# Patient Record
Sex: Male | Born: 1955 | Race: Black or African American | Hispanic: No | Marital: Married | Smoking: Former smoker
Health system: Southern US, Community
[De-identification: ages and names within clinical notes are randomized; demographics above are authoritative.]

## PROBLEM LIST (undated history)

## (undated) DIAGNOSIS — D573 Sickle-cell trait: Secondary | ICD-10-CM

## (undated) DIAGNOSIS — I10 Essential (primary) hypertension: Secondary | ICD-10-CM

## (undated) DIAGNOSIS — E785 Hyperlipidemia, unspecified: Secondary | ICD-10-CM

## (undated) HISTORY — DX: Hyperlipidemia, unspecified: E78.5

## (undated) HISTORY — PX: WRIST SURGERY: SHX841

## (undated) HISTORY — DX: Sickle-cell trait: D57.3

## (undated) HISTORY — PX: TOTAL HIP ARTHROPLASTY: SHX124

## (undated) HISTORY — DX: Essential (primary) hypertension: I10

## (undated) HISTORY — PX: POLYPECTOMY: SHX149

---

## 1999-03-13 ENCOUNTER — Emergency Department (HOSPITAL_COMMUNITY): Admission: EM | Admit: 1999-03-13 | Discharge: 1999-03-13 | Payer: Self-pay

## 1999-03-18 ENCOUNTER — Emergency Department (HOSPITAL_COMMUNITY): Admission: EM | Admit: 1999-03-18 | Discharge: 1999-03-18 | Payer: Self-pay | Admitting: Emergency Medicine

## 2000-05-20 ENCOUNTER — Emergency Department (HOSPITAL_COMMUNITY): Admission: EM | Admit: 2000-05-20 | Discharge: 2000-05-20 | Payer: Self-pay | Admitting: *Deleted

## 2000-05-21 ENCOUNTER — Encounter: Payer: Self-pay | Admitting: Emergency Medicine

## 2002-02-20 ENCOUNTER — Encounter: Admission: RE | Admit: 2002-02-20 | Discharge: 2002-02-20 | Payer: Self-pay | Admitting: Orthopedic Surgery

## 2002-02-20 ENCOUNTER — Encounter: Payer: Self-pay | Admitting: Orthopedic Surgery

## 2002-11-01 ENCOUNTER — Encounter: Payer: Self-pay | Admitting: Orthopedic Surgery

## 2002-11-06 ENCOUNTER — Encounter: Payer: Self-pay | Admitting: Orthopedic Surgery

## 2002-11-06 ENCOUNTER — Inpatient Hospital Stay (HOSPITAL_COMMUNITY): Admission: RE | Admit: 2002-11-06 | Discharge: 2002-11-10 | Payer: Self-pay | Admitting: Orthopedic Surgery

## 2008-12-17 ENCOUNTER — Emergency Department (HOSPITAL_COMMUNITY): Admission: EM | Admit: 2008-12-17 | Discharge: 2008-12-17 | Payer: Self-pay | Admitting: Emergency Medicine

## 2011-03-04 NOTE — Op Note (Signed)
NAME:  Ian Gordon, Ian Gordon                           ACCOUNT NO.:  [id number]   MEDICAL RECORD NO.:  [id number]                   PATIENT TYPE:  INP   LOCATION:  5038                                 FACILITY:  [facility]   PHYSICIAN:  Loreta Ave, M.D.              DATE OF BIRTH:  1956-01-22   DATE OF PROCEDURE:  11/06/2002  DATE OF DISCHARGE:                                 OPERATIVE REPORT   PREOPERATIVE DIAGNOSIS:  End-stage degenerative arthritis, left hip, status  post open reduction, internal fixation of acetabular fracture.   POSTOPERATIVE DIAGNOSIS:  End-stage degenerative arthritis, left hip, status  post open reduction, internal fixation of acetabular fracture.   PROCEDURE:  Left total hip replacement, Osteonics prosthesis.  58-mm Trident  cup.  36-mm internal-diameter ceramic insert, size G.  A #10 femoral  component with a 16-mm distal stem and a +0 36-mm ceramic head.  Also  removal of trochanteric and acetabular screws.   SURGEON:  Loreta Ave, M.D.   ASSISTANT:  Arlys John D. Petrarca, P.A.-C.   ANESTHESIA:  General.   BLOOD LOSS:  200 mL.   BLOOD GIVEN:  None.   SPECIMENS:  Exposed bone and soft tissue.   CULTURES:  None.   COMPLICATIONS:  None.   DRESSINGS:  Soft, compressive with abduction pillow.   PROCEDURE IN DETAIL:  The patient was brought to the operating room and  placed on the operating table in the supine position.  After adequate  anesthesia had been obtained, the patient was turned to a lateral position.  Incision utilizing previous incision superiorly extending to the trochanter  and then inferiorly over the femur.  Skin and subcutaneous tissue divided.  Iliotibial band exposed and incised.  Charnley retractor put in place.  Hip  exposed.  External rotators taken down and tagged with Ethibond.  Hip  dislocated posteriorly.  Trochanteric screws isolated and removed.  Femoral  head removed 1 cm above the lesser trochanter.  Acetabulum exposed.   Loose  bodies removed.  Spurs removed.  Reaming of the acetabulum up to 58 mm.  One  screw in the posterior aspect of the acetabulum had to be removed to  facilitate placement of the cup.  After appropriate reaming to good bleeding  bone, the cup was hammered in place at 45 degrees of abduction and 20  degrees of anteversion.  Fixed with two screws through the cup, a 16 mm and  a 20 mm.  The 0-degree, 36-mm internal-diameter, size G insert was placed in  the cup.  Retractors removed.  Femur exposed.  Sequential reaming proximally  and distally with the hand-held and power reamers up to a #10 component with  a 16-mm distal tip.  After appropriate trial reductions, definitive  component hammered in place.  Small crack in the calcar extending down about  a centimeter was appreciated with placement of the component.  This did not  gap and was not unstable.  No cable necessary.  With the +0 head, the hip  was reduced.  Excellent restoration of leg lengths.  Good stability in  flexion and extension.  Hip remained reduced.  External rotator and capsule  repaired to the back of the intertrochanteric groove through drill holes and  sutures tied over a bony bridge.  Wound irrigated.  Charnley retractor  removed.  Iliotibial band closed with #1 Vicryl.  Skin and subcutaneous  tissue with Vicryl and staples.  Margin of wound injected with Marcaine.  Sterile compressive dressing applied.  Returned to supine position.  Abduction pillow placed.  Anesthesia reversed.  Brought to the recovery  room.  Tolerated the surgery well, no complications.                                                Loreta Ave, M.D.    DFM/MEDQ  D:  11/07/2002  T:  11/07/2002  Job:  [id number]

## 2011-03-04 NOTE — Discharge Summary (Signed)
NAME:  Ian Gordon, Ian Gordon                           ACCOUNT NO.:  [id number]   MEDICAL RECORD NO.:  [id number]                   PATIENT TYPE:  INP   LOCATION:  5038                                 FACILITY:  [facility]   PHYSICIAN:  Loreta Ave, M.D.              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   PROCEDURE:  Left total hip replacement.   HISTORY OF PRESENT ILLNESS:  This is a 55 year old, African-American male  who in 1989, underwent an open reduction internal fixation of left  acetabulum for a traumatic fracture dislocation of the hip.  He has had  worsening groin pain on the left.  He was injected in May with good relief  for about three months.  He is now having worsening pain.  He is now  indicated for left total hip replacement.   HOSPITAL COURSE:  This is a 55 year old male admitted November 06, 2002, and  after appropriate laboratory studies were obtained, was taken to the  operating room where he underwent a left ceramic total hip replacement.  He  tolerated the procedure well.  He was placed preoperatively on Ancef 1 g IV  on call as well as 1 g IV q.8h. x3 doses postoperatively.  Heparin 5000  units subcu q.12h. was begun until his Coumadin became therapeutic.  PCA  Dilaudid pump was used for postop pain.  Consultation with PT/OT were made.  Ambulation with touchdown weightbearing with 5-10 pounds only on the left.  Abduction pillow was placed postoperatively.  He was allowed out of bed to  chair the following day.  He ambulated very efficiently and very quickly  postoperatively.  He had an unremarkable hospital course and was discharged  on January 25, to return  back to the office in followup.  He was discharged  in improved condition.   DISCHARGE LABORATORY DATA AND X-RAY FINDINGS:  EKG showed sinus bradycardia  with minimal voltage criteria for LVH.  This may be a normal variant.  Hip x-  ray of November 06, 2002, shows postoperative changes in the left hip with no  complicating factors.   Preoperatively, hemoglobin was 15.1, hematocrit 45.0%, white count 5700,  platelets 228,000.  Discharge hemoglobin 10.1, hematocrit 30%.  Chemistries  preoperatively with sodium 139, potassium 4.6, chloride 108, CO2 25, glucose  87, BUN 14, creatinine 1.1, calcium 9.2.  Total protein 7.0, albumin 3.8,  AST 20, ALT 16, Alk phos 61, total bilirubin 1.4.  Discharge sodium 134,  potassium 3.8, chloride 100, CO2 26, glucose 121, BUN 6, creatinine 1.1,  calcium 8.6.  Urinalysis was benign for  a voided urine.  Blood type O  positive.  Antibody screen negative.   DISCHARGE MEDICATIONS:  1. Percocet 5/325 one to two every four hours as needed for pain.  2. Coumadin 5 mg daily.   ACTIVITY:  He will be up with his walker as instructed by physical therapy.  Follow total hip replacement protocol.   DIET:  No restrictions.   WOUND CARE:  Keep wound clean and dry with dressing on daily.  [facility] for  home health.   FOLLOW UP:  Follow back up with [place] in 10-14 days for recheck evaluation.   CONDITION ON DISCHARGE:  Improved.     Oris Drone Petrarca, P.A.-C.                Loreta Ave, M.D.    BDP/MEDQ  D:  12/23/2002  T:  12/24/2002  Job:  [id number]

## 2013-12-27 ENCOUNTER — Encounter (HOSPITAL_COMMUNITY): Payer: Self-pay | Admitting: Emergency Medicine

## 2013-12-27 ENCOUNTER — Emergency Department (HOSPITAL_COMMUNITY)
Admission: EM | Admit: 2013-12-27 | Discharge: 2013-12-27 | Disposition: A | Discharge status: Home / Self Care | Payer: No Typology Code available for payment source | Source: Home / Self Care

## 2013-12-27 DIAGNOSIS — I10 Essential (primary) hypertension: Secondary | ICD-10-CM

## 2013-12-27 DIAGNOSIS — R0781 Pleurodynia: Secondary | ICD-10-CM

## 2013-12-27 DIAGNOSIS — R079 Chest pain, unspecified: Secondary | ICD-10-CM

## 2013-12-27 LAB — POCT URINALYSIS DIP (DEVICE)
Bilirubin Urine: NEGATIVE
Glucose, UA: NEGATIVE mg/dL
Hgb urine dipstick: NEGATIVE
Ketones, ur: NEGATIVE mg/dL
Leukocytes, UA: NEGATIVE
Nitrite: NEGATIVE
Protein, ur: NEGATIVE mg/dL
Specific Gravity, Urine: 1.025 (ref 1.005–1.030)
Urobilinogen, UA: 0.2 mg/dL (ref 0.0–1.0)
pH: 5.5 (ref 5.0–8.0)

## 2013-12-27 MED ORDER — NAPROXEN 375 MG PO TABS: 375.0000 mg | ORAL_TABLET | Freq: Two times a day (BID) | ORAL | Status: DC

## 2013-12-27 NOTE — ED Provider Notes (Signed)
CSN: [id number]     Arrival date & time 12/27/13  1217 History   First MD Initiated Contact with Patient 12/27/13 1454     Chief Complaint  Patient presents with  . Back Pain   (Consider location/radiation/quality/duration/timing/severity/associated sxs/prior Treatment) HPI Comments: 58 year old male complaining of pain in the right lateral chest and back for 2 weeks. It is becoming progressively worse. It is located along the midaxillary line towards the posterior axillary line over the costochondral margins. It is worse with sitting improved with lying. It is described as sharp and achy.   History reviewed. No pertinent past medical history. History reviewed. No pertinent past surgical history. No family history on file. History  Substance Use Topics  . Smoking status: Never Smoker   . Smokeless tobacco: Not on file  . Alcohol Use: Yes    Review of Systems  Constitutional: Negative for chills, diaphoresis, appetite change and fatigue.  HENT: Negative.   Respiratory: Negative.  Negative for cough, choking, chest tightness, shortness of breath, wheezing and stridor.   Cardiovascular: Negative for chest pain, palpitations and leg swelling.  Gastrointestinal: Negative for nausea, vomiting, abdominal pain, diarrhea, constipation and blood in stool.  Genitourinary: Negative for dysuria, urgency, frequency, hematuria and difficulty urinating.  Musculoskeletal: Positive for back pain. Negative for neck pain.  Skin: Negative.   Neurological: Negative.     Allergies  Review of patient's allergies indicates no known allergies.  Home Medications   Current Outpatient Rx  Name  Route  Sig  Dispense  Refill  . ibuprofen (ADVIL,MOTRIN) 200 MG tablet   Oral   Take 200 mg by mouth every 6 (six) hours as needed.         . naproxen (NAPROSYN) 375 MG tablet   Oral   Take 1 tablet (375 mg total) by mouth 2 (two) times daily.   20 tablet   0    BP 150/102  Pulse 71  Temp(Src) 98.2  F (36.8 C) (Oral)  Resp 16  SpO2 100% Physical Exam  Nursing note and vitals reviewed. Constitutional: He is oriented to person, place, and time. He appears well-developed and well-nourished.  HENT:  Head: Normocephalic and atraumatic.  Eyes: Conjunctivae and EOM are normal. Left eye exhibits no discharge.  Neck: Normal range of motion. Neck supple.  Cardiovascular: Normal rate, regular rhythm and normal heart sounds.   Pulmonary/Chest: Effort normal and breath sounds normal. No respiratory distress. He has no wheezes. He has no rales. He exhibits tenderness.  Reproducible tenderness along the right lower costal margin from the anterolateral aspect to the right posterior axillary line.  Abdominal: Soft. He exhibits no distension and no mass. There is no tenderness. There is no rebound and no guarding.  Musculoskeletal: He exhibits no edema.  Having patient leaned forward to flex this time elicits pain in the area described above. Having the patient lean forward and rotate to the left also  reproduces and  exacerbates the pain.  Neurological: He is alert and oriented to person, place, and time. No cranial nerve deficit. He exhibits normal muscle tone.  Skin: Skin is warm and dry. No rash noted. No erythema.  Psychiatric: He has a normal mood and affect.    ED Course  Procedures (including critical care time) Labs Review Labs Reviewed  POCT URINALYSIS DIP (DEVICE)   Imaging Review No results found. Results for orders placed during the hospital encounter of 12/27/13  POCT URINALYSIS DIP (DEVICE)      Result Value Ref  Range   Glucose, UA NEGATIVE  NEGATIVE mg/dL   Bilirubin Urine NEGATIVE  NEGATIVE   Ketones, ur NEGATIVE  NEGATIVE mg/dL   Specific Gravity, Urine 1.025  1.005 - 1.030   Hgb urine dipstick NEGATIVE  NEGATIVE   pH 5.5  5.0 - 8.0   Protein, ur NEGATIVE  NEGATIVE mg/dL   Urobilinogen, UA 0.2  0.0 - 1.0 mg/dL   Nitrite NEGATIVE  NEGATIVE   Leukocytes, UA NEGATIVE   NEGATIVE     MDM   1. Rib pain on right side   2. HTN (hypertension)      Naprosyn as dir, take with food Limit carrying heavy objects esp L arm Ice locally. Discussed red flags sx's for GI, pulmonary or GU problems.  See PCP for HTN     Janne Napoleon, NP 12/27/13 1516  Janne Napoleon, NP 12/27/13 [phone]

## 2013-12-27 NOTE — ED Notes (Signed)
Right side pain, radiates to right back, no known injury.  Pain has gradually worsened since onset of pain.  Onset ofpain 2 weeks ago.  No pain changes with deep inspiration, but does change with movement.  Denies uri symptoms

## 2013-12-27 NOTE — Discharge Instructions (Signed)
Chest Wall Pain °Chest wall pain is pain in or around the bones and muscles of your chest. It may take up to 6 weeks to get better. It may take longer if you must stay physically active in your work and activities.  °CAUSES  °Chest wall pain may happen on its own. However, it may be caused by: °· A viral illness like the flu. °· Injury. °· Coughing. °· Exercise. °· Arthritis. °· Fibromyalgia. °· Shingles. °HOME CARE INSTRUCTIONS  °· Avoid overtiring physical activity. Try not to strain or perform activities that cause pain. This includes any activities using your chest or your abdominal and side muscles, especially if heavy weights are used. °· Put ice on the sore area. °· Put ice in a plastic bag. °· Place a towel between your skin and the bag. °· Leave the ice on for 15-20 minutes per hour while awake for the first 2 days. °· Only take over-the-counter or prescription medicines for pain, discomfort, or fever as directed by your caregiver. °SEEK IMMEDIATE MEDICAL CARE IF:  °· Your pain increases, or you are very uncomfortable. °· You have a fever. °· Your chest pain becomes worse. °· You have new, unexplained symptoms. °· You have nausea or vomiting. °· You feel sweaty or lightheaded. °· You have a cough with phlegm (sputum), or you cough up blood. °MAKE SURE YOU:  °· Understand these instructions. °· Will watch your condition. °· Will get help right away if you are not doing well or get worse. °Document Released: 10/03/2005 Document Revised: 12/26/2011 Document Reviewed: 05/30/2011 °ExitCare® Patient Information ©2014 [employer]. ° °Costochondritis °Costochondritis, sometimes called Tietze syndrome, is a swelling and irritation (inflammation) of the tissue (cartilage) that connects your ribs with your breastbone (sternum). It causes pain in the chest and rib area. Costochondritis usually goes away on its own over time. It can take up to 6 weeks or longer to get better, especially if you are unable to limit your  activities. °CAUSES  °Some cases of costochondritis have no known cause. Possible causes include: °· Injury (trauma). °· Exercise or activity such as lifting. °· Severe coughing. °SIGNS AND SYMPTOMS °· Pain and tenderness in the chest and rib area. °· Pain that gets worse when coughing or taking deep breaths. °· Pain that gets worse with specific movements. °DIAGNOSIS  °Your health care provider will do a physical exam and ask about your symptoms. Chest X-rays or other tests may be done to rule out other problems. °TREATMENT  °Costochondritis usually goes away on its own over time. Your health care provider may prescribe medicine to help relieve pain. °HOME CARE INSTRUCTIONS  °· Avoid exhausting physical activity. Try not to strain your ribs during normal activity. This would include any activities using chest, abdominal, and side muscles, especially if heavy weights are used. °· Apply ice to the affected area for the first 2 days after the pain begins. °· Put ice in a plastic bag. °· Place a towel between your skin and the bag. °· Leave the ice on for 20 minutes, 2 3 times a day. °· Only take over-the-counter or prescription medicines as directed by your health care provider. °SEEK MEDICAL CARE IF: °· You have redness or swelling at the rib joints. These are signs of infection. °· Your pain does not go away despite rest or medicine. °SEEK IMMEDIATE MEDICAL CARE IF:  °· Your pain increases or you are very uncomfortable. °· You have shortness of breath or difficulty breathing. °· You   cough up blood. °· You have worse chest pains, sweating, or vomiting. °· You have a fever or persistent symptoms for more than 2 3 days. °· You have a fever and your symptoms suddenly get worse. °MAKE SURE YOU:  °· Understand these instructions. °· Will watch your condition. °· Will get help right away if you are not doing well or get worse. °Document Released: 07/13/2005 Document Revised: 07/24/2013 Document Reviewed:  05/07/2013 °ExitCare® Patient Information ©2014 [employer]. ° °

## 2013-12-28 NOTE — ED Provider Notes (Signed)
Medical screening examination/treatment/procedure(s) were performed by resident physician or non-physician practitioner and as supervising physician I was immediately available for consultation/collaboration.   KINDL,JAMES DOUGLAS MD.   James D Kindl, MD 12/28/13 1412 

## 2015-12-17 ENCOUNTER — Encounter: Payer: Self-pay | Admitting: Gastroenterology

## 2016-01-05 ENCOUNTER — Ambulatory Visit (AMBULATORY_SURGERY_CENTER): Payer: Self-pay

## 2016-01-05 VITALS — Ht 69.0 in | Wt 187.2 lb

## 2016-01-05 DIAGNOSIS — Z1211 Encounter for screening for malignant neoplasm of colon: Secondary | ICD-10-CM

## 2016-01-05 MED ORDER — NA SULFATE-K SULFATE-MG SULF 17.5-3.13-1.6 GM/177ML PO SOLN: ORAL | Status: DC

## 2016-01-05 NOTE — Progress Notes (Signed)
Per pt, no allergies to soy or egg products.Pt not taking any weight loss meds or using  O2 at home. 

## 2016-01-18 ENCOUNTER — Ambulatory Visit (AMBULATORY_SURGERY_CENTER): Payer: BLUE CROSS/BLUE SHIELD | Admitting: Gastroenterology

## 2016-01-18 ENCOUNTER — Encounter: Payer: Self-pay | Admitting: Gastroenterology

## 2016-01-18 VITALS — BP 106/76 | HR 71 | Temp 86.8°F | Resp 11 | Ht 69.0 in | Wt 187.0 lb

## 2016-01-18 DIAGNOSIS — D125 Benign neoplasm of sigmoid colon: Secondary | ICD-10-CM | POA: Diagnosis not present

## 2016-01-18 DIAGNOSIS — Z1211 Encounter for screening for malignant neoplasm of colon: Secondary | ICD-10-CM

## 2016-01-18 DIAGNOSIS — D122 Benign neoplasm of ascending colon: Secondary | ICD-10-CM

## 2016-01-18 HISTORY — PX: COLONOSCOPY: SHX174

## 2016-01-18 MED ORDER — SODIUM CHLORIDE 0.9 % IV SOLN: 500.0000 mL | INTRAVENOUS | Status: DC

## 2016-01-18 NOTE — Op Note (Signed)
[facility] Patient Name: Ian Gordon Procedure Date: 01/18/2016 3:33 PM MRN: [id number] Endoscopist: Remo Lipps P. Havery Moros , MD Age: 60 Referring MD:  Date of Birth: [birth year] Gender: Male Procedure:                Colonoscopy Indications:              Screening for colorectal malignant neoplasm Medicines:                Monitored Anesthesia Care Procedure:                Pre-Anesthesia Assessment:                           - Prior to the procedure, a History and Physical                            was performed, and patient medications and                            allergies were reviewed. The patient's tolerance of                            previous anesthesia was also reviewed. The risks                            and benefits of the procedure and the sedation                            options and risks were discussed with the patient.                            All questions were answered, and informed consent                            was obtained. Prior Anticoagulants: The patient has                            taken no previous anticoagulant or antiplatelet                            agents. ASA Grade Assessment: II - A patient with                            mild systemic disease. After reviewing the risks                            and benefits, the patient was deemed in                            satisfactory condition to undergo the procedure.                           After obtaining informed consent, the colonoscope  was passed under direct vision. Throughout the                            procedure, the patient's blood pressure, pulse, and                            oxygen saturations were monitored continuously. The                            Model PCF-H190L [id number]) scope was introduced                            through the anus and advanced to the the cecum,                            identified by appendiceal orifice and  ileocecal                            valve. The colonoscopy was performed without                            difficulty. The patient tolerated the procedure                            well. The quality of the bowel preparation was                            adequate. The ileocecal valve, appendiceal orifice,                            and rectum were photographed. Scope In: 3:35:55 PM Scope Out: 3:50:07 PM Scope Withdrawal Time: 0 hours 12 minutes 15 seconds  Total Procedure Duration: 0 hours 14 minutes 12 seconds  Findings:      The perianal and digital rectal examinations were normal.      Two sessile polyps were found in the ascending colon. The polyps were 3       to 5 mm in size. These polyps were removed with a cold snare. Resection       and retrieval were complete.      A 4 mm polyp was found in the sigmoid colon. The polyp was sessile. The       polyp was removed with a cold snare. Resection and retrieval were       complete.      Multiple small and large-mouthed diverticula were found in the left       colon.      Non-bleeding internal hemorrhoids were found during retroflexion. The       hemorrhoids were small.      The exam was otherwise normal throughout the examined colon. Complications:            No immediate complications. Estimated blood loss:                            Minimal. Estimated Blood Loss:     Estimated blood loss was minimal. Impression:               -  Two 3 to 5 mm polyps in the ascending colon,                            removed with a cold snare. Resected and retrieved.                           - One 4 mm polyp in the sigmoid colon, removed with                            a cold snare. Resected and retrieved.                           - Diverticulosis in the left colon.                           - Non-bleeding internal hemorrhoids. Recommendation:           - Patient has a contact number available for                            emergencies. The  signs and symptoms of potential                            delayed complications were discussed with the                            patient. Return to normal activities tomorrow.                            Written discharge instructions were provided to the                            patient.                           - Resume previous diet.                           - Continue present medications.                           - No aspirin, ibuprofen, naproxen, or other                            non-steroidal anti-inflammatory drugs for 1 week                            after polyp removal.                           - Await pathology results.                           - Repeat colonoscopy is recommended for  surveillance. The colonoscopy date will be                            determined after pathology results from today's                            exam become available for review. Procedure Code(s):        --- Professional ---                           [phone], Colonoscopy, flexible; with removal of                            tumor(s), polyp(s), or other lesion(s) by snare                            technique CPT copyright 2016 [employer]. All rights reserved. Remo Lipps P. Taliah Porche, MD 01/18/2016 3:55:02 PM This report has been signed electronically. Number of Addenda: 0 Referring MD:      Berkley Harvey

## 2016-01-18 NOTE — Progress Notes (Signed)
Called to room to assist during endoscopic procedure.  Patient ID and intended procedure confirmed with present staff. Received instructions for my participation in the procedure from the performing physician.  

## 2016-01-18 NOTE — Patient Instructions (Signed)
Impressions/recommendations:  Polyps (handout given) Diverticulosis (handout given) High Fiber Diet (handout given) Hemorrhoids (handout given)  No aspirin, aspirin containing products or NSAIDS for 2 weeks. May resume 4/18 if needed. Tylenol only until then.  YOU HAD AN ENDOSCOPIC PROCEDURE TODAY AT [facility]:   Refer to the procedure report that was given to you for any specific questions about what was found during the examination.  If the procedure report does not answer your questions, please call your gastroenterologist to clarify.  If you requested that your care partner not be given the details of your procedure findings, then the procedure report has been included in a sealed envelope for you to review at your convenience later.  YOU SHOULD EXPECT: Some feelings of bloating in the abdomen. Passage of more gas than usual.  Walking can help get rid of the air that was put into your GI tract during the procedure and reduce the bloating. If you had a lower endoscopy (such as a colonoscopy or flexible sigmoidoscopy) you may notice spotting of blood in your stool or on the toilet paper. If you underwent a bowel prep for your procedure, you may not have a normal bowel movement for a few days.  Please Note:  You might notice some irritation and congestion in your nose or some drainage.  This is from the oxygen used during your procedure.  There is no need for concern and it should clear up in a day or so.  SYMPTOMS TO REPORT IMMEDIATELY:   Following lower endoscopy (colonoscopy or flexible sigmoidoscopy):  Excessive amounts of blood in the stool  Significant tenderness or worsening of abdominal pains  Swelling of the abdomen that is new, acute  Fever of 100F or higher  For urgent or emergent issues, a gastroenterologist can be reached at any hour by calling [phone].   DIET: Your first meal following the procedure should be a small meal and then it is ok to  progress to your normal diet. Heavy or fried foods are harder to digest and may make you feel nauseous or bloated.  Likewise, meals heavy in dairy and vegetables can increase bloating.  Drink plenty of fluids but you should avoid alcoholic beverages for 24 hours.  ACTIVITY:  You should plan to take it easy for the rest of today and you should NOT DRIVE or use heavy machinery until tomorrow (because of the sedation medicines used during the test).    FOLLOW UP: Our staff will call the number listed on your records the next business day following your procedure to check on you and address any questions or concerns that you may have regarding the information given to you following your procedure. If we do not reach you, we will leave a message.  However, if you are feeling well and you are not experiencing any problems, there is no need to return our call.  We will assume that you have returned to your regular daily activities without incident.  If any biopsies were taken you will be contacted by phone or by letter within the next 1-3 weeks.  Please call us at [phone] if you have not heard about the biopsies in 3 weeks.    SIGNATURES/CONFIDENTIALITY: You and/or your care partner have signed paperwork which will be entered into your electronic medical record.  These signatures attest to the fact that that the information above on your After Visit Summary has been reviewed and is understood.  Full responsibility of the  confidentiality of this discharge information lies with you and/or your care-partner. 

## 2016-01-18 NOTE — Progress Notes (Signed)
Report to PACU, RN, vss, BBS= Clear.  

## 2016-01-19 ENCOUNTER — Telehealth: Payer: Self-pay

## 2016-01-19 NOTE — Telephone Encounter (Signed)
  Follow up Call-  Call back number 01/18/2016  Post procedure Call Back phone  # [phone]  Permission to leave phone message Yes     Patient questions:  Do you have a fever, pain , or abdominal swelling? No. Pain Score  0 *  Have you tolerated food without any problems? Yes.    Have you been able to return to your normal activities? Yes.    Do you have any questions about your discharge instructions: Diet   No. Medications  No. Follow up visit  No.  Do you have questions or concerns about your Care? No.  Actions: * If pain score is 4 or above: No action needed, pain <4.

## 2016-01-30 ENCOUNTER — Encounter: Payer: Self-pay | Admitting: Gastroenterology

## 2019-01-15 ENCOUNTER — Emergency Department (HOSPITAL_COMMUNITY): Payer: 59

## 2019-01-15 ENCOUNTER — Encounter (HOSPITAL_COMMUNITY): Payer: Self-pay | Admitting: Emergency Medicine

## 2019-01-15 ENCOUNTER — Emergency Department (HOSPITAL_COMMUNITY)
Admission: EM | Admit: 2019-01-15 | Discharge: 2019-01-15 | Disposition: A | Discharge status: Home / Self Care | Payer: 59 | Attending: Emergency Medicine | Admitting: Emergency Medicine

## 2019-01-15 ENCOUNTER — Other Ambulatory Visit: Payer: Self-pay

## 2019-01-15 DIAGNOSIS — S01111A Laceration without foreign body of right eyelid and periocular area, initial encounter: Secondary | ICD-10-CM | POA: Insufficient documentation

## 2019-01-15 DIAGNOSIS — Z96642 Presence of left artificial hip joint: Secondary | ICD-10-CM | POA: Insufficient documentation

## 2019-01-15 DIAGNOSIS — Y999 Unspecified external cause status: Secondary | ICD-10-CM | POA: Insufficient documentation

## 2019-01-15 DIAGNOSIS — Y929 Unspecified place or not applicable: Secondary | ICD-10-CM | POA: Insufficient documentation

## 2019-01-15 DIAGNOSIS — S0181XA Laceration without foreign body of other part of head, initial encounter: Secondary | ICD-10-CM

## 2019-01-15 DIAGNOSIS — Y939 Activity, unspecified: Secondary | ICD-10-CM | POA: Insufficient documentation

## 2019-01-15 DIAGNOSIS — I10 Essential (primary) hypertension: Secondary | ICD-10-CM | POA: Insufficient documentation

## 2019-01-15 DIAGNOSIS — W0110XA Fall on same level from slipping, tripping and stumbling with subsequent striking against unspecified object, initial encounter: Secondary | ICD-10-CM | POA: Insufficient documentation

## 2019-01-15 DIAGNOSIS — Y908 Blood alcohol level of 240 mg/100 ml or more: Secondary | ICD-10-CM | POA: Insufficient documentation

## 2019-01-15 DIAGNOSIS — Z23 Encounter for immunization: Secondary | ICD-10-CM | POA: Insufficient documentation

## 2019-01-15 DIAGNOSIS — S0990XA Unspecified injury of head, initial encounter: Secondary | ICD-10-CM | POA: Insufficient documentation

## 2019-01-15 DIAGNOSIS — Z79899 Other long term (current) drug therapy: Secondary | ICD-10-CM | POA: Diagnosis not present

## 2019-01-15 LAB — CBC WITH DIFFERENTIAL/PLATELET
ABS IMMATURE GRANULOCYTES: 0.01 10*3/uL (ref 0.00–0.07)
BASOS ABS: 0 10*3/uL (ref 0.0–0.1)
Basophils Relative: 1 %
Eosinophils Absolute: 0.2 10*3/uL (ref 0.0–0.5)
Eosinophils Relative: 3 %
HCT: 40.5 % (ref 39.0–52.0)
HEMOGLOBIN: 14.5 g/dL (ref 13.0–17.0)
IMMATURE GRANULOCYTES: 0 %
LYMPHS ABS: 2.5 10*3/uL (ref 0.7–4.0)
LYMPHS PCT: 41 %
MCH: 28.7 pg (ref 26.0–34.0)
MCHC: 35.8 g/dL (ref 30.0–36.0)
MCV: 80 fL (ref 80.0–100.0)
MONOS PCT: 10 %
Monocytes Absolute: 0.6 10*3/uL (ref 0.1–1.0)
NEUTROS PCT: 45 %
NRBC: 0 % (ref 0.0–0.2)
Neutro Abs: 2.8 10*3/uL (ref 1.7–7.7)
Platelets: 230 10*3/uL (ref 150–400)
RBC: 5.06 MIL/uL (ref 4.22–5.81)
RDW: 14.4 % (ref 11.5–15.5)
WBC: 6.1 10*3/uL (ref 4.0–10.5)

## 2019-01-15 LAB — COMPREHENSIVE METABOLIC PANEL
ALBUMIN: 3.7 g/dL (ref 3.5–5.0)
ALK PHOS: 66 U/L (ref 38–126)
ALT: 16 U/L (ref 0–44)
AST: 18 U/L (ref 15–41)
Anion gap: 7 (ref 5–15)
BILIRUBIN TOTAL: 0.8 mg/dL (ref 0.3–1.2)
BUN: 14 mg/dL (ref 8–23)
CALCIUM: 8.3 mg/dL — AB (ref 8.9–10.3)
CO2: 26 mmol/L (ref 22–32)
CREATININE: 1.15 mg/dL (ref 0.61–1.24)
Chloride: 104 mmol/L (ref 98–111)
GFR calc Af Amer: >60 mL/min (ref >60)
GFR calc non Af Amer: >60 mL/min (ref >60)
GLUCOSE: 90 mg/dL (ref 70–99)
POTASSIUM: 3.6 mmol/L (ref 3.5–5.1)
Sodium: 137 mmol/L (ref 135–145)
TOTAL PROTEIN: 7.1 g/dL (ref 6.5–8.1)

## 2019-01-15 LAB — ETHANOL: Alcohol, Ethyl (B): 273 mg/dL — ABNORMAL HIGH (ref <10)

## 2019-01-15 MED ORDER — TETANUS-DIPHTH-ACELL PERTUSSIS 5-2.5-18.5 LF-MCG/0.5 IM SUSP
0.5000 mL | Freq: Once | INTRAMUSCULAR | Status: AC
  Administered 2019-01-15: 0.5 mL via INTRAMUSCULAR
  Filled 2019-01-15: qty 0.5

## 2019-01-15 MED ORDER — LIDOCAINE-EPINEPHRINE (PF) 2 %-1:200000 IJ SOLN
20.0000 mL | Freq: Once | INTRAMUSCULAR | Status: AC
  Administered 2019-01-15: 20 mL
  Filled 2019-01-15: qty 20

## 2019-01-15 NOTE — ED Notes (Signed)
Patient verbalizes understanding of discharge instructions. Opportunity for questioning and answers were provided. Armband removed by staff, pt discharged from ED ambulatory to home.  

## 2019-01-15 NOTE — ED Provider Notes (Signed)
..  Laceration Repair Date/Time: 01/15/2019 10:19 PM Performed by: Volanda Napoleon, PA-C Authorized by: Volanda Napoleon, PA-C   Consent:    Consent obtained:  Verbal   Consent given by:  Patient   Risks discussed:  Infection, need for additional repair, pain, poor cosmetic result and poor wound healing   Alternatives discussed:  No treatment and delayed treatment Universal protocol:    Procedure explained and questions answered to patient or proxy's satisfaction: yes     Relevant documents present and verified: yes     Test results available and properly labeled: yes     Imaging studies available: yes     Required blood products, implants, devices, and special equipment available: yes     Site/side marked: yes     Immediately prior to procedure, a time out was called: yes     Patient identity confirmed:  Verbally with patient Anesthesia (see MAR for exact dosages):    Anesthesia method:  Local infiltration   Local anesthetic:  Lidocaine 1% WITH epi Laceration details:    Location:  Face   Face location:  R upper eyelid   Extent:  Partial thickness   Length (cm):  3 Repair type:    Repair type:  Intermediate Pre-procedure details:    Preparation:  Patient was prepped and draped in usual sterile fashion Exploration:    Hemostasis achieved with:  Direct pressure   Wound exploration: wound explored through full range of motion   Treatment:    Area cleansed with:  Betadine and Hibiclens   Amount of cleaning:  Extensive   Irrigation solution:  Sterile saline   Irrigation method:  Syringe   Visualized foreign bodies/material removed: no   Subcutaneous repair:    Suture size:  5-0   Suture material:  Vicryl   Suture technique:  Simple interrupted   Number of sutures:  2 Skin repair:    Repair method:  Sutures   Suture size:  5-0   Suture material:  Prolene   Suture technique:  Simple interrupted   Number of sutures:  6 Approximation:    Approximation:   Close Post-procedure details:    Dressing:  Antibiotic ointment and non-adherent dressing      Volanda Napoleon, PA-C 01/15/19 2220    Julianne Rice, MD 01/15/19 2314

## 2019-01-15 NOTE — ED Triage Notes (Signed)
Pt from home with ems for unwitnessed fall, pt has been drinking since 2pm today. Denies Si but states he has been going through a lot lately. C collar in place, laceration to left eyebrow. No blood thinners, denies chest pain or SOB. Pt alert upon arrival

## 2019-01-15 NOTE — ED Provider Notes (Signed)
[facility] Provider Note   CSN: [id number] Arrival date & time: 01/15/19  1941    History   Chief Complaint Chief Complaint  Patient presents with  . Fall  . Alcohol Intoxication    HPI Ian Gordon is a 63 y.o. male.     HPI Patient presents by EMS after unwitnessed fall.  He is amnestic to the event.  States he has been drinking alcohol this afternoon.  He denies any pain currently.  Sustained laceration above the right eye.  Unknown last tetanus.  Denies focal weakness or numbness.  Denies neck pain. Past Medical History:  Diagnosis Date  . Hypertension     There are no active problems to display for this patient.   Past Surgical History:  Procedure Laterality Date  . TOTAL HIP ARTHROPLASTY     left hip  . WRIST SURGERY     right wrist/nerve surg        Home Medications    Prior to Admission medications   Medication Sig Start Date End Date Taking? Authorizing Provider  atorvastatin (LIPITOR) 10 MG tablet Take 10 mg by mouth. 12/14/15 12/13/16  [provider]  ibuprofen (ADVIL,MOTRIN) 200 MG tablet Take 200 mg by mouth every 6 (six) hours as needed. Reported on 01/05/2016    [provider]  lisinopril (PRINIVIL,ZESTRIL) 20 MG tablet Take 20 mg by mouth daily.    [provider]  Multiple Vitamin (MULTIVITAMIN) tablet Take 1 tablet by mouth daily.    [provider]  naproxen (NAPROSYN) 375 MG tablet Take 1 tablet (375 mg total) by mouth 2 (two) times daily. Patient not taking: Reported on 01/05/2016 12/27/13   Janne Napoleon, NP  Phenyleph-CPM-DM-APAP (TYLENOL COLD MULTI-SYMPTOM PO) Take by mouth as needed.    [provider]    Family History Family History  Problem Relation Age of Onset  . Hypertension Sister     Social History Social History   Tobacco Use  . Smoking status: Never Smoker  . Smokeless tobacco: Never Used  Substance Use Topics  . Alcohol use: Yes   Alcohol/week: 0.0 standard drinks  . Drug use: No     Allergies   Patient has no known allergies.   Review of Systems Review of Systems  Constitutional: Negative for chills and fever.  HENT: Negative for facial swelling and trouble swallowing.   Eyes: Negative for pain and visual disturbance.  Respiratory: Negative for cough and shortness of breath.   Cardiovascular: Negative for chest pain and leg swelling.  Gastrointestinal: Negative for abdominal pain, constipation, diarrhea, nausea and vomiting.  Musculoskeletal: Negative for back pain, myalgias and neck pain.  Skin: Negative for rash and wound.  Neurological: Positive for syncope. Negative for dizziness, weakness, light-headedness, numbness and headaches.  All other systems reviewed and are negative.    Physical Exam Updated Vital Signs BP 123/89 (BP Location: Right Arm)   Pulse 88   Temp 97.9 F (36.6 C) (Oral)   Resp (!) 23   SpO2 96%   Physical Exam Vitals signs and nursing note reviewed.  Constitutional:      Appearance: Normal appearance. He is well-developed.  HENT:     Head: Normocephalic.     Comments: Patient has superficial 2 cm laceration to the right upper eyelid.  Minimal bleeding.  No evidence of underlying globe injury.  Pupils are equal and reactive.  Midface is stable.  No malocclusion.  No intraoral trauma.    Nose:  Nose normal.     Mouth/Throat:     Mouth: Mucous membranes are moist.  Eyes:     Pupils: Pupils are equal, round, and reactive to light.  Neck:     Musculoskeletal: Normal range of motion and neck supple. No neck rigidity or muscular tenderness.     Vascular: No carotid bruit.     Comments: No posterior midline cervical tenderness to palpation. Cardiovascular:     Rate and Rhythm: Normal rate and regular rhythm.     Heart sounds: No murmur. No friction rub. No gallop.   Pulmonary:     Effort: Pulmonary effort is normal. No respiratory distress.     Breath sounds: Normal breath  sounds. No stridor. No wheezing, rhonchi or rales.  Chest:     Chest wall: No tenderness.  Abdominal:     General: Bowel sounds are normal.     Palpations: Abdomen is soft.     Tenderness: There is no abdominal tenderness. There is no guarding or rebound.  Musculoskeletal: Normal range of motion.        General: No swelling, tenderness, deformity or signs of injury.     Right lower leg: No edema.     Left lower leg: No edema.     Comments: No midline thoracic or lumbar tenderness.  No lower extremity swelling, asymmetry or tenderness.  Distal pulses are 2+.  Lymphadenopathy:     Cervical: No cervical adenopathy.  Skin:    General: Skin is warm and dry.     Coloration: Skin is not jaundiced.     Findings: No bruising, erythema, lesion or rash.  Neurological:     General: No focal deficit present.     Mental Status: He is alert and oriented to person, place, and time.     Comments: Patient is not clinically intoxicated.  Speaks with clear voice.  Answers questions appropriately.  5/5 motor in all extremities.  Sensation fully intact.  Psychiatric:        Mood and Affect: Mood normal.        Behavior: Behavior normal.      ED Treatments / Results  Labs (all labs ordered are listed, but only abnormal results are displayed) Labs Reviewed  COMPREHENSIVE METABOLIC PANEL - Abnormal; Notable for the following components:      Result Value   Calcium 8.3 (*)    All other components within normal limits  ETHANOL - Abnormal; Notable for the following components:   Alcohol, Ethyl (B) 273 (*)    All other components within normal limits  CBC WITH DIFFERENTIAL/PLATELET    EKG EKG Interpretation  Date/Time:  Tuesday January 15 2019 20:01:45 EDT Ventricular Rate:  87 PR Interval:    QRS Duration: 88 QT Interval:  364 QTC Calculation: 438 R Axis:   69 Text Interpretation:  Sinus rhythm Consider left atrial enlargement Posterior infarct, old Borderline T abnormalities, inferior leads  Confirmed by Julianne Rice [phone]) on 01/15/2019 10:33:13 PM   Radiology Ct Head Wo Contrast  Result Date: 01/15/2019 CLINICAL DATA:  Posttraumatic headache after unwitnessed fall. EXAM: CT HEAD WITHOUT CONTRAST TECHNIQUE: Contiguous axial images were obtained from the base of the skull through the vertex without intravenous contrast. COMPARISON:  None. FINDINGS: Brain: Mild chronic ischemic white matter disease is noted. No mass effect or midline shift is noted. Ventricular size is within normal limits. There is no evidence of mass lesion, hemorrhage or acute infarction. Vascular: No hyperdense vessel or unexpected calcification. Skull: Normal. Negative  for fracture or focal lesion. Sinuses/Orbits: Left maxillary mucous retention cysts are noted. Other: None. IMPRESSION: Mild chronic ischemic white matter disease. No acute intracranial abnormality seen. Electronically Signed   By: Marijo Conception, M.D.   On: 01/15/2019 20:19    Procedures Procedures (including critical care time)  Medications Ordered in ED Medications  lidocaine-EPINEPHrine (XYLOCAINE W/EPI) 2 %-1:200000 (PF) injection 20 mL (has no administration in time range)  Tdap (BOOSTRIX) injection 0.5 mL (0.5 mLs Intramuscular Given 01/15/19 2028)     Initial Impression / Assessment and Plan / ED Course  I have reviewed the triage vital signs and the nursing notes.  Pertinent labs & imaging results that were available during my care of the patient were reviewed by me and considered in my medical decision making (see chart for details).       Patient does not appear to be clinically intoxicated.  No slurring of the words.  Normal neurologic exam.  CT head without acute finding.  Right eyelid laceration repaired.  See Evette Cristal, PA procedure note.  Final Clinical Impressions(s) / ED Diagnoses   Final diagnoses:  Injury of head, initial encounter  Facial laceration, initial encounter    ED Discharge Orders    None        Julianne Rice, MD 01/15/19 2233

## 2019-02-01 ENCOUNTER — Emergency Department (HOSPITAL_COMMUNITY)
Admission: EM | Admit: 2019-02-01 | Discharge: 2019-02-01 | Disposition: A | Discharge status: Home / Self Care | Payer: 59 | Attending: Emergency Medicine | Admitting: Emergency Medicine

## 2019-02-01 ENCOUNTER — Other Ambulatory Visit: Payer: Self-pay

## 2019-02-01 ENCOUNTER — Encounter (HOSPITAL_COMMUNITY): Payer: Self-pay | Admitting: Emergency Medicine

## 2019-02-01 DIAGNOSIS — Z4802 Encounter for removal of sutures: Secondary | ICD-10-CM | POA: Diagnosis present

## 2019-02-01 NOTE — ED Triage Notes (Signed)
Her for suture removal above rt eye brow 8 sutures area healed no s/s of redness or infection

## 2019-02-01 NOTE — ED Provider Notes (Signed)
[facility]PART[facility] Provider Note   CSN: [id number] Arrival date & time: 02/01/19  1100    History   Chief Complaint Chief Complaint  Patient presents with  . Suture / Staple Removal    HPI Ian Gordon is a 63 y.o. male.     63 year old gentleman with hypertension presenting to the emergency department for suture removal.  Patient had a fall about 2 weeks ago.  Sutures placed above the left lateral eye.  Reports healing well without signs of infection.  No drainage, redness, fever     Past Medical History:  Diagnosis Date  . Hypertension     There are no active problems to display for this patient.   Past Surgical History:  Procedure Laterality Date  . TOTAL HIP ARTHROPLASTY     left hip  . WRIST SURGERY     right wrist/nerve surg        Home Medications    Prior to Admission medications   Medication Sig Start Date End Date Taking? Authorizing Provider  atorvastatin (LIPITOR) 10 MG tablet Take 10 mg by mouth. 12/14/15 12/13/16  [provider]  ibuprofen (ADVIL,MOTRIN) 200 MG tablet Take 200 mg by mouth every 6 (six) hours as needed. Reported on 01/05/2016    [provider]  lisinopril (PRINIVIL,ZESTRIL) 20 MG tablet Take 20 mg by mouth daily.    [provider]  Multiple Vitamin (MULTIVITAMIN) tablet Take 1 tablet by mouth daily.    [provider]  naproxen (NAPROSYN) 375 MG tablet Take 1 tablet (375 mg total) by mouth 2 (two) times daily. Patient not taking: Reported on 01/05/2016 12/27/13   Janne Napoleon, NP  Phenyleph-CPM-DM-APAP (TYLENOL COLD MULTI-SYMPTOM PO) Take by mouth as needed.    [provider]    Family History Family History  Problem Relation Age of Onset  . Hypertension Sister     Social History Social History   Tobacco Use  . Smoking status: Never Smoker  . Smokeless tobacco: Never Used  Substance Use Topics  . Alcohol use: Yes    Alcohol/week: 0.0 standard  drinks  . Drug use: No     Allergies   Patient has no known allergies.   Review of Systems Review of Systems  Constitutional: Negative for appetite change and fever.  HENT: Negative for congestion.   Eyes: Negative.   Skin: Negative for color change, rash and wound.  Allergic/Immunologic: Negative for immunocompromised state.  Neurological: Negative for dizziness, light-headedness and headaches.  Hematological: Does not bruise/bleed easily.     Physical Exam Updated Vital Signs BP (!) 134/94 (BP Location: Right Arm)   Pulse 83   Temp 98.2 F (36.8 C)   Resp 16   SpO2 100%   Physical Exam Vitals signs and nursing note reviewed.  Constitutional:      Appearance: Normal appearance.  HENT:     Head: Normocephalic.      Comments: Linear, well-healed scar without drainage, surrounding erythema or swelling. Eyes:     Conjunctiva/sclera: Conjunctivae normal.  Pulmonary:     Effort: Pulmonary effort is normal.  Skin:    General: Skin is dry.  Neurological:     Mental Status: He is alert.  Psychiatric:        Mood and Affect: Mood normal.      ED Treatments / Results  Labs (all labs ordered are listed, but only abnormal results are displayed) Labs Reviewed - No data to display  EKG None  Radiology No results found.  Procedures Procedures (including critical care time)  Medications Ordered in ED Medications - No data to display   Initial Impression / Assessment and Plan / ED Course  I have reviewed the triage vital signs and the nursing notes.  Pertinent labs & imaging results that were available during my care of the patient were reviewed by me and considered in my medical decision making (see chart for details).  Clinical Course as of Feb 01 1123  Fri Feb 01, 2019  1123 Sutures removed without complication.  Wound is healing well.   [KM]    Clinical Course User Index [KM] Alveria Apley, PA-C       Based on review of vitals, medical  screening exam, lab work and/or imaging, there does not appear to be an acute, emergent etiology for the patient's symptoms. Counseled pt on good return precautions and encouraged both PCP and ED follow-up as needed.  Prior to discharge, I also discussed incidental imaging findings with patient in detail and advised appropriate, recommended follow-up in detail.  Clinical Impression: 1. Visit for suture removal     Disposition: Discharge  Prior to providing a prescription for a controlled substance, I independently reviewed the patient's recent prescription history on the [place]. The patient had no recent or regular prescriptions and was deemed appropriate for a brief, less than 3 day prescription of narcotic for acute analgesia.  This note was prepared with assistance of Systems analyst. Occasional wrong-word or sound-a-like substitutions may have occurred due to the inherent limitations of voice recognition software.   Final Clinical Impressions(s) / ED Diagnoses   Final diagnoses:  Visit for suture removal    ED Discharge Orders    None       Alveria Apley, PA-C 02/01/19 [place], DO 02/01/19 1234

## 2019-02-01 NOTE — Discharge Instructions (Signed)
Thank you for allowing me to care for you today. Please return to the emergency department if you have new or worsening symptoms. Take your medications as instructed.  ° °

## 2019-02-26 ENCOUNTER — Encounter: Payer: Self-pay | Admitting: Gastroenterology

## 2019-03-29 ENCOUNTER — Other Ambulatory Visit: Payer: Self-pay

## 2019-03-29 ENCOUNTER — Ambulatory Visit: Payer: 59 | Admitting: *Deleted

## 2019-03-29 VITALS — Ht 71.0 in | Wt 190.0 lb

## 2019-03-29 DIAGNOSIS — Z86010 Personal history of colonic polyps: Secondary | ICD-10-CM

## 2019-03-29 MED ORDER — NA SULFATE-K SULFATE-MG SULF 17.5-3.13-1.6 GM/177ML PO SOLN: ORAL | 0 refills | Status: DC

## 2019-03-29 NOTE — Progress Notes (Signed)
Patient's pre-visit was done today over the phone with the patient due to COVID-19 pandemic. Name,DOB and address verified. Insurance verified. Packet of Prep instructions mailed to patient including copy of a consent form and pre-procedure patient acknowledgement form-pt is aware. Suprep Coupon included. Patient understands to call us back with any questions or concerns.   Patient denies any allergies to eggs or soy. Patient denies any problems with anesthesia/sedation. Patient denies any oxygen use at home. Patient denies taking any diet/weight loss medications or blood thinners. EMMI education assisgned to patient on colonoscopy, this was explained and instructions given to patient.

## 2019-04-03 ENCOUNTER — Encounter: Payer: Self-pay | Admitting: Gastroenterology

## 2019-04-11 ENCOUNTER — Telehealth: Payer: Self-pay | Admitting: Gastroenterology

## 2019-04-11 NOTE — Telephone Encounter (Signed)

## 2019-04-12 ENCOUNTER — Ambulatory Visit (AMBULATORY_SURGERY_CENTER): Payer: 59 | Admitting: Gastroenterology

## 2019-04-12 ENCOUNTER — Encounter: Payer: Self-pay | Admitting: Gastroenterology

## 2019-04-12 ENCOUNTER — Other Ambulatory Visit: Payer: Self-pay

## 2019-04-12 VITALS — BP 115/83 | HR 66 | Temp 98.6°F | Resp 13 | Ht 71.0 in | Wt 190.0 lb

## 2019-04-12 DIAGNOSIS — Z86010 Personal history of colonic polyps: Secondary | ICD-10-CM | POA: Diagnosis not present

## 2019-04-12 MED ORDER — SODIUM CHLORIDE 0.9 % IV SOLN: 500.0000 mL | INTRAVENOUS | Status: DC

## 2019-04-12 NOTE — Progress Notes (Signed)
A and O x3. Report to RN. Tolerated MAC anesthesia well.

## 2019-04-12 NOTE — Patient Instructions (Signed)
Information on hemorrhoids and diverticulosis given to you today.  YOU HAD AN ENDOSCOPIC PROCEDURE TODAY AT [facility]:   Refer to the procedure report that was given to you for any specific questions about what was found during the examination.  If the procedure report does not answer your questions, please call your gastroenterologist to clarify.  If you requested that your care partner not be given the details of your procedure findings, then the procedure report has been included in a sealed envelope for you to review at your convenience later.  YOU SHOULD EXPECT: Some feelings of bloating in the abdomen. Passage of more gas than usual.  Walking can help get rid of the air that was put into your GI tract during the procedure and reduce the bloating. If you had a lower endoscopy (such as a colonoscopy or flexible sigmoidoscopy) you may notice spotting of blood in your stool or on the toilet paper. If you underwent a bowel prep for your procedure, you may not have a normal bowel movement for a few days.  Please Note:  You might notice some irritation and congestion in your nose or some drainage.  This is from the oxygen used during your procedure.  There is no need for concern and it should clear up in a day or so.  SYMPTOMS TO REPORT IMMEDIATELY:   Following lower endoscopy (colonoscopy or flexible sigmoidoscopy):  Excessive amounts of blood in the stool  Significant tenderness or worsening of abdominal pains  Swelling of the abdomen that is new, acute  Fever of 100F or higher   For urgent or emergent issues, a gastroenterologist can be reached at any hour by calling [phone].   DIET:  We do recommend a small meal at first, but then you may proceed to your regular diet.  Drink plenty of fluids but you should avoid alcoholic beverages for 24 hours.  ACTIVITY:  You should plan to take it easy for the rest of today and you should NOT DRIVE or use heavy machinery until  tomorrow (because of the sedation medicines used during the test).    FOLLOW UP: Our staff will call the number listed on your records 48-72 hours following your procedure to check on you and address any questions or concerns that you may have regarding the information given to you following your procedure. If we do not reach you, we will leave a message.  We will attempt to reach you two times.  During this call, we will ask if you have developed any symptoms of COVID 19. If you develop any symptoms (ie: fever, flu-like symptoms, shortness of breath, cough etc.) before then, please call [phone].  If you test positive for Covid 19 in the 2 weeks post procedure, please call and report this information to [place].    If any biopsies were taken you will be contacted by phone or by letter within the next 1-3 weeks.  Please call us at [phone] if you have not heard about the biopsies in 3 weeks.    SIGNATURES/CONFIDENTIALITY: You and/or your care partner have signed paperwork which will be entered into your electronic medical record.  These signatures attest to the fact that that the information above on your After Visit Summary has been reviewed and is understood.  Full responsibility of the confidentiality of this discharge information lies with you and/or your care-partner.

## 2019-04-12 NOTE — Op Note (Signed)
[place] Patient Name: Ian Gordon Procedure Date: 04/12/2019 9:49 AM MRN: [id number] Endoscopist: Ian Gordon , MD Age: 63 Referring MD:  Date of Birth: [birth year] Gender: Male Account #: [id number] Procedure:                Colonoscopy Indications:              Surveillance: Personal history of adenomatous                            polyps on last colonoscopy 3 years ago (3 small                            adenomas removed 2017) Medicines:                Monitored Anesthesia Care Procedure:                Pre-Anesthesia Assessment:                           - Prior to the procedure, a History and Physical                            was performed, and patient medications and                            allergies were reviewed. The patient's tolerance of                            previous anesthesia was also reviewed. The risks                            and benefits of the procedure and the sedation                            options and risks were discussed with the patient.                            All questions were answered, and informed consent                            was obtained. Prior Anticoagulants: The patient has                            taken no previous anticoagulant or antiplatelet                            agents. ASA Grade Assessment: II - A patient with                            mild systemic disease. After reviewing the risks                            and benefits, the patient was deemed in  satisfactory condition to undergo the procedure.                           After obtaining informed consent, the colonoscope                            was passed under direct vision. Throughout the                            procedure, the patient's blood pressure, pulse, and                            oxygen saturations were monitored continuously. The                            Colonoscope was introduced through the  anus and                            advanced to the the cecum, identified by                            appendiceal orifice and ileocecal valve. The                            colonoscopy was performed without difficulty. The                            patient tolerated the procedure well. The quality                            of the bowel preparation was good. The ileocecal                            valve, appendiceal orifice, and rectum were                            photographed. Scope In: 9:57:16 AM Scope Out: 10:12:25 AM Scope Withdrawal Time: 0 hours 13 minutes 18 seconds  Total Procedure Duration: 0 hours 15 minutes 9 seconds  Findings:                 The perianal and digital rectal examinations were                            normal.                           Multiple medium-mouthed diverticula were found in                            the transverse colon and left colon.                           Internal hemorrhoids were found during  retroflexion. The hemorrhoids were small.                           The exam was otherwise without abnormality. Complications:            No immediate complications. Estimated blood loss:                            None. Estimated Blood Loss:     Estimated blood loss: none. Impression:               - Diverticulosis in the transverse colon and in the                            left colon.                           - Internal hemorrhoids.                           - The examination was otherwise normal. No polyps                           - No specimens collected. Recommendation:           - Patient has a contact number available for                            emergencies. The signs and symptoms of potential                            delayed complications were discussed with the                            patient. Return to normal activities tomorrow.                            Written discharge instructions were  provided to the                            patient.                           - Resume previous diet.                           - Continue present medications.                           - Repeat colonoscopy in 7-10 years for surveillance. Ian Lipps P. Ian Faul, MD 04/12/2019 10:17:32 AM This report has been signed electronically.

## 2019-04-16 ENCOUNTER — Telehealth: Payer: Self-pay | Admitting: *Deleted

## 2019-04-16 ENCOUNTER — Telehealth: Payer: Self-pay

## 2019-04-16 NOTE — Telephone Encounter (Signed)
Follow up call made, left a voicemail. 

## 2019-04-16 NOTE — Telephone Encounter (Signed)
  Follow up Call-  Call back number 04/12/2019  Post procedure Call Back phone  # [phone]  Permission to leave phone message Yes  Some recent data might be hidden    LMOM to call back if any questions or yes to the following-  1. Have you developed a fever since your procedure?   2.   Have you had an respiratory symptoms (SOB or cough) since your procedure?  3.   Have you tested positive for COVID 19 since your procedure  4.   Have you had any family members/close contacts diagnosed with the COVID 19 since your procedure?    If yes to any of these questions please route to Joylene John, RN and Alphonsa Gin, RN.

## 2020-06-15 IMAGING — CT CT HEAD WITHOUT CONTRAST
4 series · 17 of 47 positions shown, 19 images · non-contrast
Comparison: None.

CLINICAL DATA: Posttraumatic headache after unwitnessed fall.

EXAM:
CT HEAD WITHOUT CONTRAST
TECHNIQUE: Contiguous axial images were obtained from the base of the skull
through the vertex without intravenous contrast.

[Series 3: head without · axial · non-contrast · 0.46mm/px · z∈[+1107,+1227]mm · 7 of 32 slices shown, 9 images]
[im 4/32  brain]
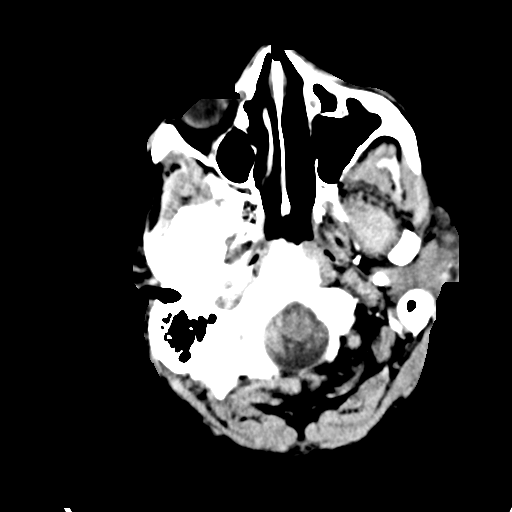
[im 4/32  bone]
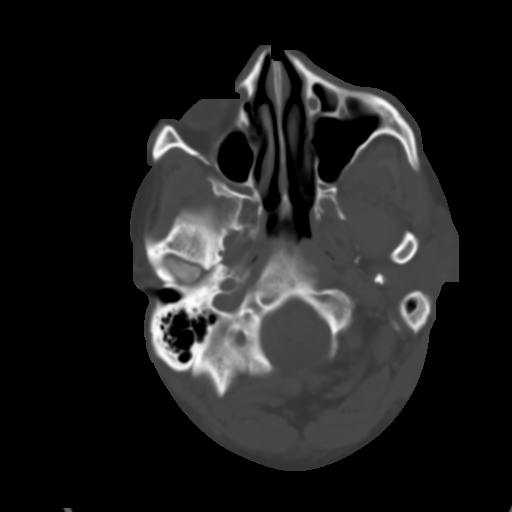
[im 8/32  brain]
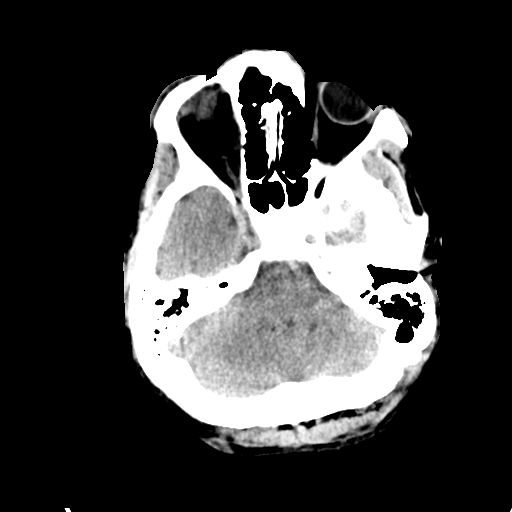
[im 12/32  brain]
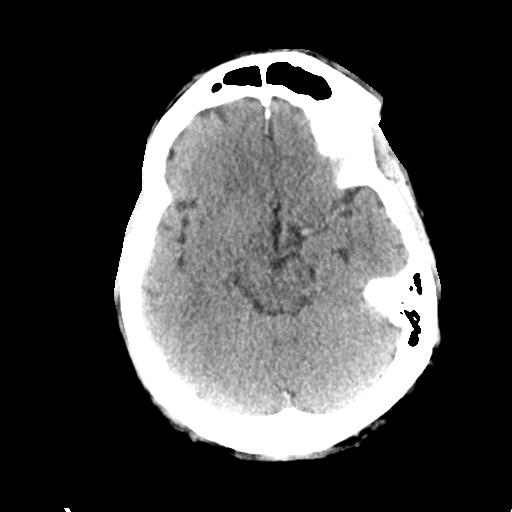
[im 16/32  brain]
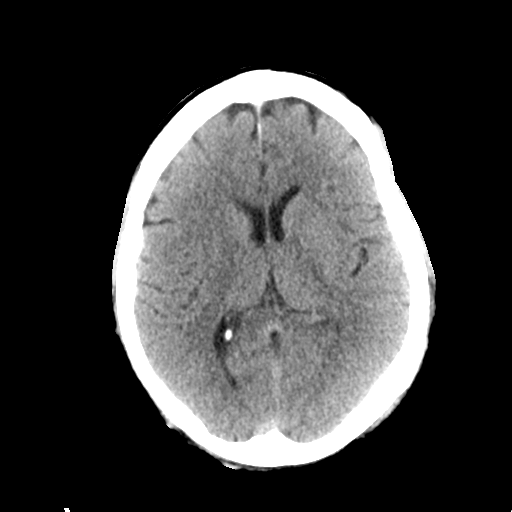
[im 20/32  brain]
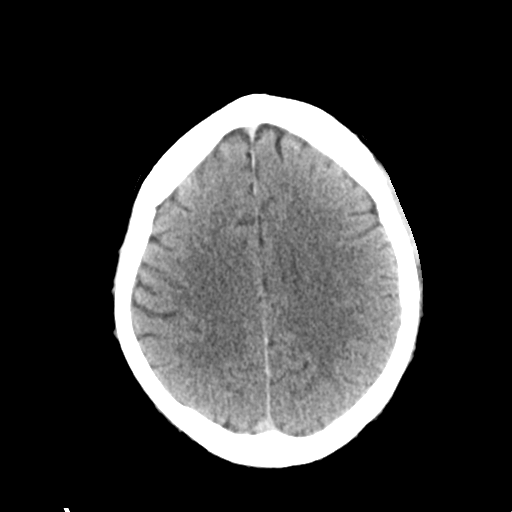
[im 20/32  bone]
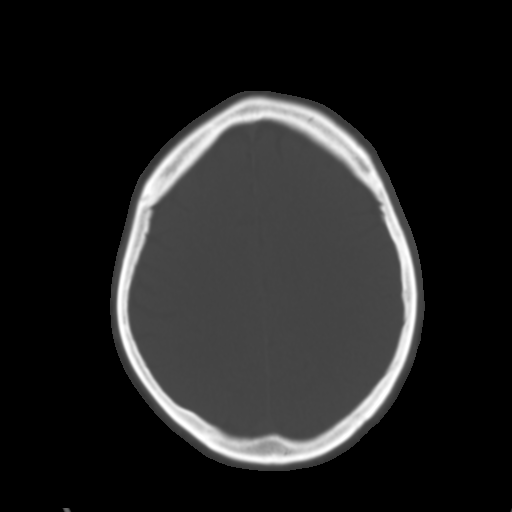
[im 24/32  brain]
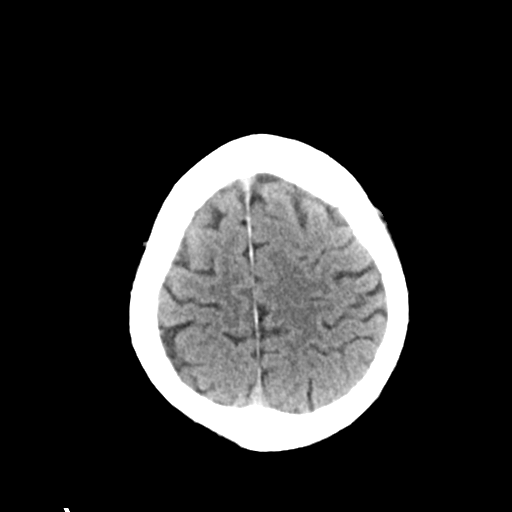
[im 28/32  brain]
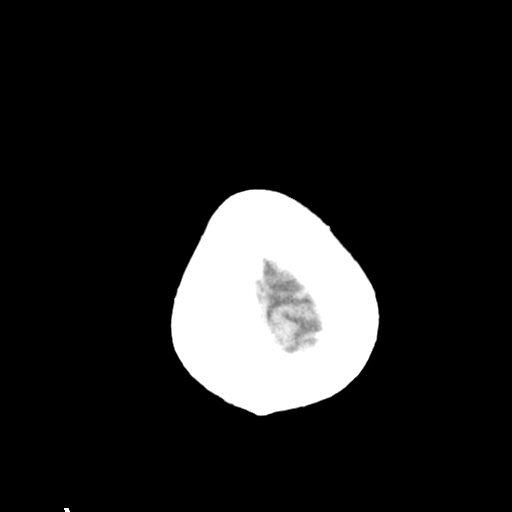

[Series 4: head bone · axial · 0.46mm/px · z∈[+1106,+1162]mm · 4 of 80 slices shown]
[im 8/80  bone]
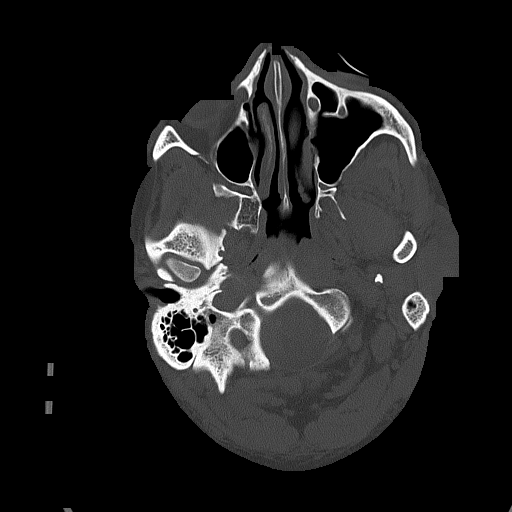
[im 16/80  bone]
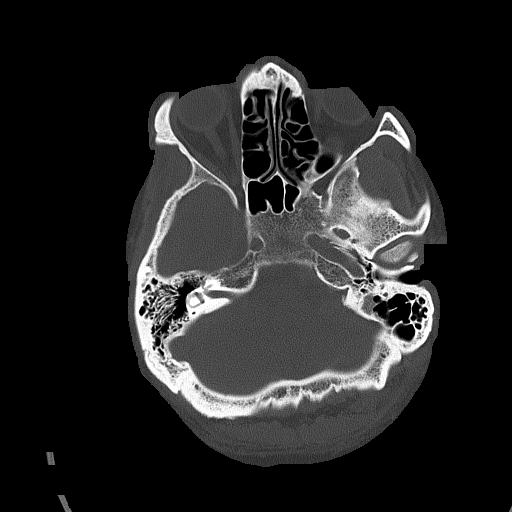
[im 24/80  bone]
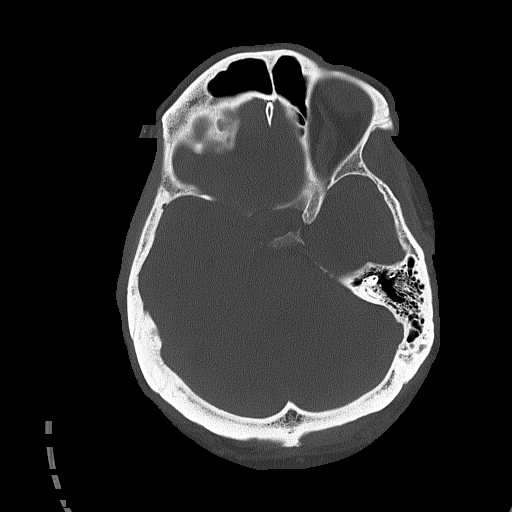
[im 36/80  bone]
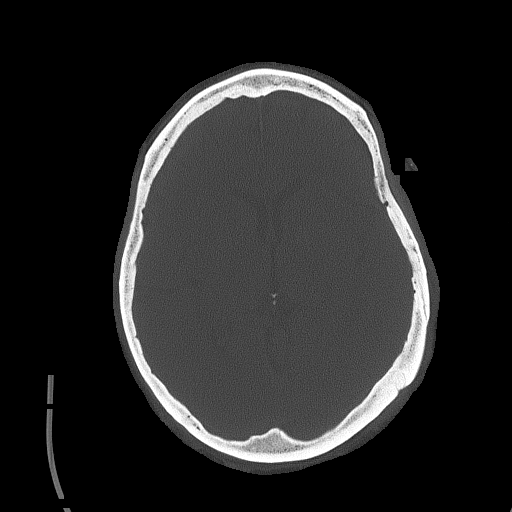

[Series 5: head without cor · coronal · non-contrast · 0.32mm/px · 3 of 71 slices shown]
[im 24/71  brain]
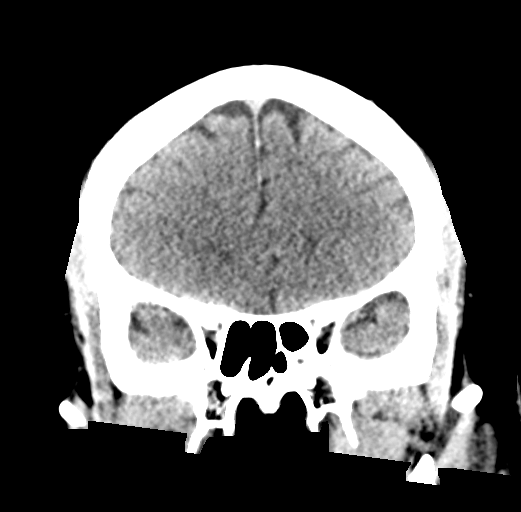
[im 32/71  brain]
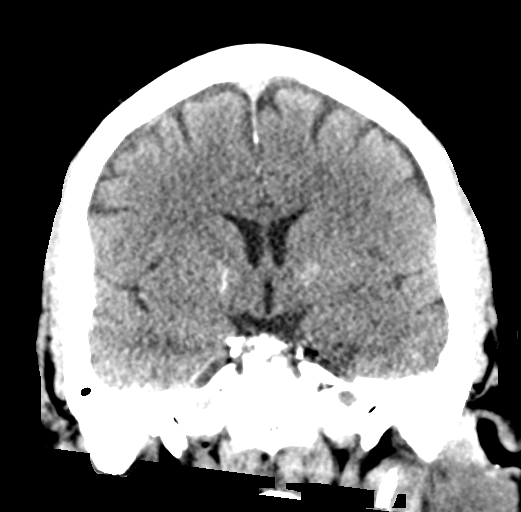
[im 39/71  brain]
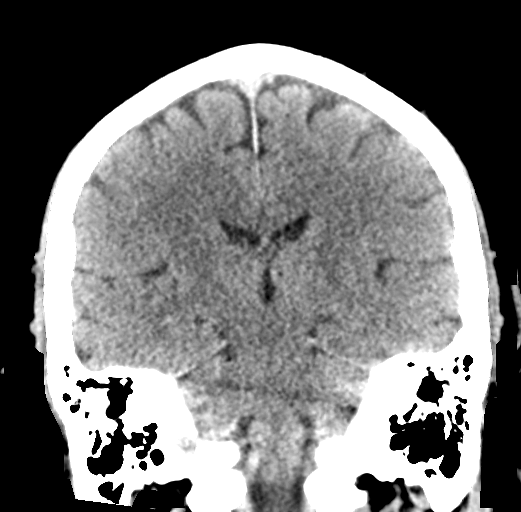

[Series 6: head without sag · sagittal · non-contrast · 0.31mm/px · 3 of 56 slices shown]
[im 22/56  brain]
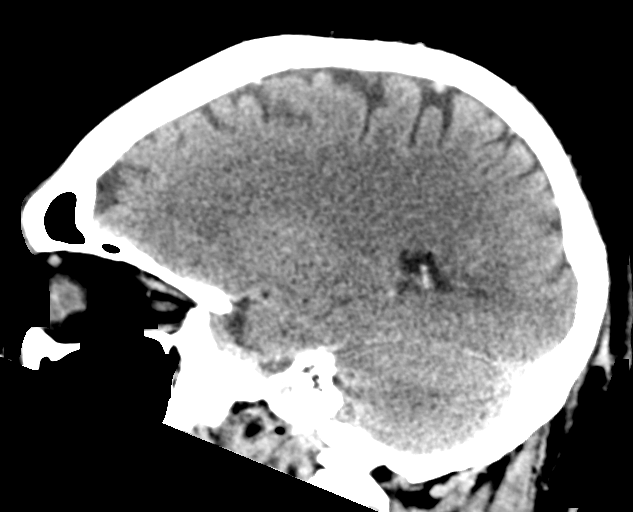
[im 28/56  brain]
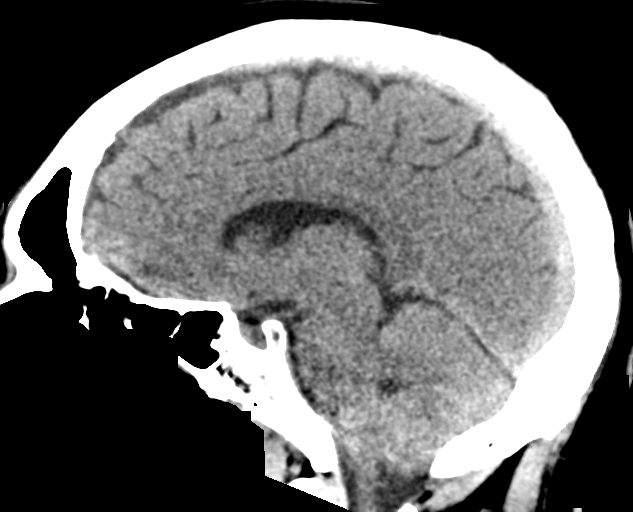
[im 34/56  brain]
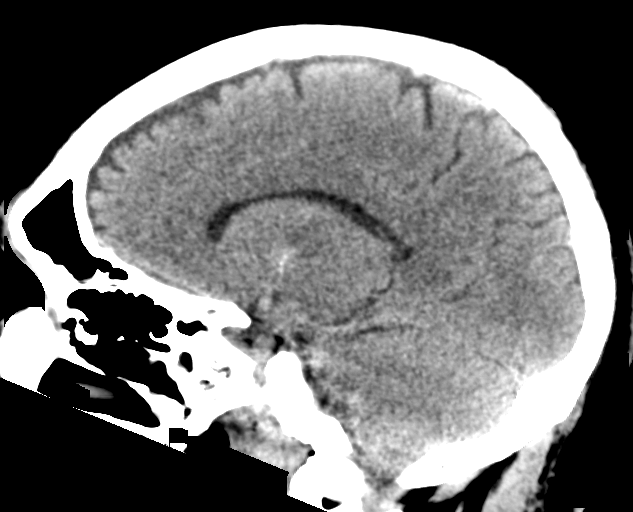

[17 of 47 positions shown; findings below may reference images not displayed]

FINDINGS: Brain: Mild chronic ischemic white matter disease is noted. No mass
effect or midline shift is noted. Ventricular size is within normal
limits. There is no evidence of mass lesion, hemorrhage or acute
infarction.

Vascular: No hyperdense vessel or unexpected calcification.

Skull: Normal. Negative for fracture or focal lesion.

Sinuses/Orbits: Left maxillary mucous retention cysts are noted.

Other: None.
IMPRESSION: Mild chronic ischemic white matter disease. No acute intracranial
abnormality seen.

## 2020-11-20 ENCOUNTER — Ambulatory Visit: Payer: 59 | Admitting: Cardiology

## 2020-11-20 ENCOUNTER — Encounter: Payer: Self-pay | Admitting: Cardiology

## 2020-11-20 ENCOUNTER — Other Ambulatory Visit: Payer: Self-pay

## 2020-11-20 VITALS — BP 140/96 | HR 86 | Temp 98.2°F | Resp 16 | Ht 71.0 in | Wt 197.0 lb

## 2020-11-20 DIAGNOSIS — E782 Mixed hyperlipidemia: Secondary | ICD-10-CM

## 2020-11-20 DIAGNOSIS — I10 Essential (primary) hypertension: Secondary | ICD-10-CM

## 2020-11-20 DIAGNOSIS — Z87891 Personal history of nicotine dependence: Secondary | ICD-10-CM

## 2020-11-20 NOTE — Progress Notes (Signed)
Date:  11/20/2020   ID:  Ian Gordon, DOB 1956-06-21, MRN [id number]  PCP:  Berkley Harvey, NP  Cardiologist:  Rex Kras, DO, [facility] (established care 11/20/2020)  REASON FOR CONSULT: Essential Hypertension.   REQUESTING PHYSICIAN:  Berkley Harvey, NP [place],  [place] [address]  Chief Complaint  Patient presents with  . Hypertension  . New Patient (Initial Visit)    HPI  Ian Gordon is a 65 y.o. male who presents to the office with a chief complaint of " blood pressure management and heart checkup." Patient's past medical history and cardiovascular risk factors include: Hypertension, former smoker, HLD, advance age.   He is referred to the office at the request of Berkley Harvey, NP for evaluation of hypertension.  Very pleasant 65 year old African-American gentleman who recently went to his PCP for yearly physical in January 2022.  He had an EKG performed and based on physical examination findings was requested to follow-up with cardiology for a cardiovascular checkup and management of high blood pressure.  Patient has no significant cardiac history.  He does not have any chest pain or shortness of breath at rest or with effort related activities.  His home blood pressures are usually around 191 systolics and diastolics are usually 80 mmHg.  No blood pressure log to review.  His blood pressure at today's office visit is not well controlled.  Patient states that he has been off of his blood pressure medications for at least 1 week.  He has a prescription but forgot to pick it up from the pharmacy.  According to the patient his diet is not very high in salt.  However, he does consume canned foods at times and does consume a lot of frozen meals.  Outside labs from January 2022 independently reviewed from care everywhere.  FUNCTIONAL STATUS: Walks about 50minutes a day and regularly does push ups.    ALLERGIES: No Known Allergies  MEDICATION LIST PRIOR  TO VISIT: Current Meds  Medication Sig  . amLODipine (NORVASC) 10 MG tablet Take 1 tablet by mouth daily.  Marland Kitchen aspirin EC 81 MG tablet Take by mouth.  Marland Kitchen atorvastatin (LIPITOR) 20 MG tablet Take 20 mg by mouth daily.  Marland Kitchen losartan (COZAAR) 100 MG tablet Take 100 mg by mouth daily.  . Multiple Vitamin (MULTIVITAMIN) tablet Take 1 tablet by mouth daily.     PAST MEDICAL HISTORY: Past Medical History:  Diagnosis Date  . Hyperlipidemia   . Hypertension   . Sickle cell trait ([place])     PAST SURGICAL HISTORY: Past Surgical History:  Procedure Laterality Date  . COLONOSCOPY  01/18/2016  . POLYPECTOMY    . TOTAL HIP ARTHROPLASTY     left hip  . WRIST SURGERY     right wrist/nerve surg    FAMILY HISTORY: The patient family history includes Congestive Heart Failure in his mother; Hypertension in his sister.  SOCIAL HISTORY:  The patient  reports that he has quit smoking. He has never used smokeless tobacco. He reports current alcohol use of about 2.0 standard drinks of alcohol per week. He reports that he does not use drugs.  REVIEW OF SYSTEMS: Review of Systems  Constitutional: Negative for chills and fever.  HENT: Negative for hoarse voice and nosebleeds.   Eyes: Negative for discharge, double vision and pain.  Cardiovascular: Negative for chest pain, claudication, dyspnea on exertion, leg swelling, near-syncope, orthopnea, palpitations, paroxysmal nocturnal dyspnea and syncope.  Respiratory:  Negative for hemoptysis and shortness of breath.   Musculoskeletal: Negative for muscle cramps and myalgias.  Gastrointestinal: Negative for abdominal pain, constipation, diarrhea, hematemesis, hematochezia, melena, nausea and vomiting.  Neurological: Negative for dizziness and light-headedness.    PHYSICAL EXAM: Vitals with BMI 11/20/2020 11/20/2020 04/12/2019  Height - $Remove'5\' 11"'whQhjnv$  -  Weight - 197 lbs -  BMI - 35.70 -  Systolic 177 939 030  Diastolic 96 92 83  Pulse 86 90 66    CONSTITUTIONAL:  Well-developed and well-nourished. No acute distress.  SKIN: Skin is warm and dry. No rash noted. No cyanosis. No pallor. No jaundice HEAD: Normocephalic and atraumatic.  EYES: No scleral icterus MOUTH/THROAT: Moist oral membranes.  NECK: No JVD present. No thyromegaly noted. No carotid bruits  LYMPHATIC: No visible cervical adenopathy.  CHEST Normal respiratory effort. No intercostal retractions  LUNGS: Clear to auscultation bilaterally.  No stridor. No wheezes. No rales.  CARDIOVASCULAR: Regular rate and rhythm, positive S1-S2, no murmurs rubs or gallops appreciated. ABDOMINAL: Soft, nontender, nondistended, positive bowel sounds in all 4 quadrants no apparent ascites.  EXTREMITIES: No peripheral edema  HEMATOLOGIC: No significant bruising NEUROLOGIC: Oriented to person, place, and time. Nonfocal. Normal muscle tone.  PSYCHIATRIC: Normal mood and affect. Normal behavior. Cooperative  CARDIAC DATABASE: EKG: 11/20/20: Sinus Rhythm, 82bpm, LAE, normal axis, PRWP, without underlying ischemia or injury pattern.   Echocardiogram: No results found for this or any previous visit from the past 1095 days.  Stress Testing: No results found for this or any previous visit from the past 1095 days.  Heart Catheterization: None  LABORATORY DATA: External Labs: Collected: 10/23/2020 at [facility] Creatinine 1.1 mg/dL. eGFR: >60 mL/min per 1.73 m Lipid profile: Total cholesterol 176, triglycerides 54, HDL 83, LDL 82, non-HDL 93  IMPRESSION:    ICD-10-CM   1. Benign hypertension  I10 EKG 12-Lead    PCV CARDIAC STRESS TEST    PCV ECHOCARDIOGRAM COMPLETE    SARS-COV-2 RNA,(COVID-19) QUAL NAAT  2. Mixed hyperlipidemia  E78.2   3. Former smoker  Z87.891      RECOMMENDATIONS: Ian Gordon is a 65 y.o. male whose past medical history and cardiac risk factors include: Hypertension, hyperlipidemia, former smoker, advanced age.  Benign essential  hypertension:  Patient's office blood pressures are not well controlled this is most likely secondary to him not taking his BP medications for the last 1 to 2 weeks.  Patient is asked to pick up the prescription from the pharmacy and to resume his pharmacological therapy.  He is asked to keep a log of his blood pressures and to bring it in at the next office visit.  We also discussed conservative management in regards to decreasing salt consumption in his diet.  Patient is informed to rinse canned foods whenever possible prior to consuming and minimizing the frozen meals that he consumes on a regular basis.  Mixed hyperlipidemia: Currently on statin therapy.  Most recent lipid profile reviewed.  Currently managed by primary care provider.  Former smoker: Educated on the importance of continued smoking cessation.  Given his cardiovascular risk factors, EKG findings, the shared decision was to undergo cardiovascular testing.  Echocardiogram will be ordered to evaluate for structural heart disease and left ventricular systolic function.  Plan exercise treadmill stress test.  Further recommendations to follow.  FINAL MEDICATION LIST END OF ENCOUNTER: No orders of the defined types were placed in this encounter.   Medications Discontinued During This Encounter  Medication Reason  .  lisinopril (PRINIVIL,ZESTRIL) 20 MG tablet Error  . naproxen (NAPROSYN) 375 MG tablet Error     Current Outpatient Medications:  .  amLODipine (NORVASC) 10 MG tablet, Take 1 tablet by mouth daily., Disp: , Rfl:  .  aspirin EC 81 MG tablet, Take by mouth., Disp: , Rfl:  .  atorvastatin (LIPITOR) 20 MG tablet, Take 20 mg by mouth daily., Disp: , Rfl:  .  losartan (COZAAR) 100 MG tablet, Take 100 mg by mouth daily., Disp: , Rfl:  .  Multiple Vitamin (MULTIVITAMIN) tablet, Take 1 tablet by mouth daily., Disp: , Rfl:   Orders Placed This Encounter  Procedures  . SARS-COV-2 RNA,(COVID-19) QUAL NAAT  . PCV CARDIAC  STRESS TEST  . EKG 12-Lead  . PCV ECHOCARDIOGRAM COMPLETE    There are no Patient Instructions on file for this visit.   --Continue cardiac medications as reconciled in final medication list. --Return in about 4 weeks (around 12/18/2020) for Follow up, BP, Review test results. Or sooner if needed. --Continue follow-up with your primary care physician regarding the management of your other chronic comorbid conditions.  Patient's questions and concerns were addressed to his satisfaction. He voices understanding of the instructions provided during this encounter.   This note was created using a voice recognition software as a result there may be grammatical errors inadvertently enclosed that do not reflect the nature of this encounter. Every attempt is made to correct such errors.  Rex Kras, [place], [facility]  Pager: [phone] Office: [phone]

## 2020-11-27 ENCOUNTER — Other Ambulatory Visit (HOSPITAL_COMMUNITY)
Admission: RE | Admit: 2020-11-27 | Discharge: 2020-11-27 | Disposition: A | Discharge status: Home / Self Care | Payer: 59 | Source: Ambulatory Visit | Attending: Cardiology | Admitting: Cardiology

## 2020-11-27 DIAGNOSIS — Z01812 Encounter for preprocedural laboratory examination: Secondary | ICD-10-CM | POA: Insufficient documentation

## 2020-11-27 DIAGNOSIS — Z20822 Contact with and (suspected) exposure to covid-19: Secondary | ICD-10-CM | POA: Insufficient documentation

## 2020-11-27 LAB — SARS CORONAVIRUS 2 (TAT 6-24 HRS): SARS Coronavirus 2: NEGATIVE

## 2020-11-30 ENCOUNTER — Other Ambulatory Visit: Payer: Self-pay

## 2020-11-30 ENCOUNTER — Ambulatory Visit: Payer: 59

## 2020-11-30 DIAGNOSIS — I10 Essential (primary) hypertension: Secondary | ICD-10-CM

## 2020-12-11 ENCOUNTER — Telehealth: Payer: Self-pay

## 2020-12-11 ENCOUNTER — Other Ambulatory Visit: Payer: Self-pay

## 2020-12-11 ENCOUNTER — Ambulatory Visit: Payer: 59

## 2020-12-11 DIAGNOSIS — I10 Essential (primary) hypertension: Secondary | ICD-10-CM

## 2020-12-11 NOTE — Progress Notes (Signed)
Attempted to call pt, no answer. Left vm requesting call back.

## 2020-12-11 NOTE — Telephone Encounter (Signed)
-----   Message from [place], [place] sent at 12/11/2020  4:30 PM EST ----- Please inform the patient that his stress test was reported to be high risk. Please have a follow-up at the next office visit as scheduled. In the interim if he has chest pain or shortness of breath that gets worse in intensity, frequency, and/or duration please have him go to the closest ER via EMS.

## 2020-12-14 NOTE — Progress Notes (Signed)
Second attempt to contact pt regarding stress test results. No answer, left vm requesting cb.

## 2020-12-15 NOTE — Progress Notes (Signed)
Third attempt to contact pt regarding stress test results. Goes automatically to voicemail.

## 2020-12-15 NOTE — Progress Notes (Signed)
Called and spoke with Pts daughter regarding stress test results. She will relay the message.

## 2020-12-18 ENCOUNTER — Encounter: Payer: Self-pay | Admitting: Cardiology

## 2020-12-18 ENCOUNTER — Other Ambulatory Visit: Payer: Self-pay

## 2020-12-18 ENCOUNTER — Ambulatory Visit: Payer: 59 | Admitting: Cardiology

## 2020-12-18 VITALS — BP 134/84 | HR 89 | Temp 98.1°F | Resp 16 | Ht 71.0 in | Wt 199.0 lb

## 2020-12-18 DIAGNOSIS — I10 Essential (primary) hypertension: Secondary | ICD-10-CM

## 2020-12-18 DIAGNOSIS — R9439 Abnormal result of other cardiovascular function study: Secondary | ICD-10-CM

## 2020-12-18 DIAGNOSIS — Z87891 Personal history of nicotine dependence: Secondary | ICD-10-CM

## 2020-12-18 DIAGNOSIS — Z712 Person consulting for explanation of examination or test findings: Secondary | ICD-10-CM

## 2020-12-18 DIAGNOSIS — E782 Mixed hyperlipidemia: Secondary | ICD-10-CM

## 2020-12-18 MED ORDER — METOPROLOL TARTRATE 50 MG PO TABS: 50.0000 mg | ORAL_TABLET | Freq: Two times a day (BID) | ORAL | 0 refills | Status: AC

## 2020-12-18 NOTE — Progress Notes (Signed)
ID:  Ian Gordon, DOB 04/01/1956, MRN [id number]  PCP:  Berkley Harvey, NP  Cardiologist:  Rex Kras, DO, [facility] (established care 11/20/2020)  Date: 12/18/2020 Last Office Visit: 11/20/2020  Chief Complaint  Patient presents with  . Hypertension  . Follow-up  . Results    HPI  Ian Gordon is a 65 y.o. African-American male who presents to the office with a chief complaint of " blood pressure management and review test results." Patient's past medical history and cardiovascular risk factors include: Hypertension, former smoker, HLD, advance age.   He is referred to the office at the request of Berkley Harvey, NP for evaluation of hypertension.  Prior to establishing care patient noted that his home blood pressures usually average 130/80 mmHg.  However, he was not taking his medication as for at least 1 week prior to establishing care with myself as he ran out of medications.  And his diet was high in salt as he would consume significant amount of canned foods and frozen meals.  Since last office visit patient states that his home blood pressures are better controlled.  He is compliant more with medical therapy and has been consuming a low-salt diet.  In addition, the last office visit the shared decision was to proceed with an echocardiogram and exercise treadmill stress test for further stratification given his cardiovascular risk factors, EKG findings.  Results of the echo and exercise treadmill stress test reviewed with the patient at today's office visit and findings noted below for further reference.  Denies any chest pain or shortness of breath at rest or with effort related activities.  FUNCTIONAL STATUS: Walks about 64minutes a day and regularly does push ups.    ALLERGIES: No Known Allergies  MEDICATION LIST PRIOR TO VISIT: Current Meds  Medication Sig  . amLODipine (NORVASC) 10 MG tablet Take 1 tablet by mouth daily.  Marland Kitchen aspirin EC 81 MG tablet Take by mouth.  Marland Kitchen  atorvastatin (LIPITOR) 20 MG tablet Take 20 mg by mouth daily.  Marland Kitchen losartan (COZAAR) 100 MG tablet Take 100 mg by mouth daily.  . Multiple Vitamin (MULTIVITAMIN) tablet Take 1 tablet by mouth daily.  . metoprolol tartrate (LOPRESSOR) 50 MG tablet Take 1 tablet (50 mg total) by mouth 2 (two) times daily.     PAST MEDICAL HISTORY: Past Medical History:  Diagnosis Date  . Hyperlipidemia   . Hypertension   . Sickle cell trait ([place])     PAST SURGICAL HISTORY: Past Surgical History:  Procedure Laterality Date  . COLONOSCOPY  01/18/2016  . POLYPECTOMY    . TOTAL HIP ARTHROPLASTY     left hip  . WRIST SURGERY     right wrist/nerve surg    FAMILY HISTORY: The patient family history includes Congestive Heart Failure in his mother; Hypertension in his sister.  SOCIAL HISTORY:  The patient  reports that he has quit smoking. He has never used smokeless tobacco. He reports current alcohol use of about 2.0 standard drinks of alcohol per week. He reports that he does not use drugs.  REVIEW OF SYSTEMS: Review of Systems  Constitutional: Negative for chills and fever.  HENT: Negative for hoarse voice and nosebleeds.   Eyes: Negative for discharge, double vision and pain.  Cardiovascular: Negative for chest pain, claudication, dyspnea on exertion, leg swelling, near-syncope, orthopnea, palpitations, paroxysmal nocturnal dyspnea and syncope.  Respiratory: Negative for hemoptysis and shortness of breath.   Musculoskeletal: Negative for muscle cramps and myalgias.  Gastrointestinal:  Negative for abdominal pain, constipation, diarrhea, hematemesis, hematochezia, melena, nausea and vomiting.  Neurological: Negative for dizziness and light-headedness.    PHYSICAL EXAM: Vitals with BMI 12/18/2020 11/20/2020 11/20/2020  Height $Remov'5\' 11"'ondoRX$  - $'5\' 11"'m$   Weight 199 lbs - 197 lbs  BMI 82.42 - 35.36  Systolic 144 315 400  Diastolic 84 96 92  Pulse 89 86 90    CONSTITUTIONAL: Well-developed and well-nourished.  No acute distress.  SKIN: Skin is warm and dry. No rash noted. No cyanosis. No pallor. No jaundice HEAD: Normocephalic and atraumatic.  EYES: No scleral icterus MOUTH/THROAT: Moist oral membranes.  NECK: No JVD present. No thyromegaly noted. No carotid bruits  LYMPHATIC: No visible cervical adenopathy.  CHEST Normal respiratory effort. No intercostal retractions  LUNGS: Clear to auscultation bilaterally.  No stridor. No wheezes. No rales.  CARDIOVASCULAR: Regular rate and rhythm, positive S1-S2, no murmurs rubs or gallops appreciated. ABDOMINAL: Soft, nontender, nondistended, positive bowel sounds in all 4 quadrants no apparent ascites.  EXTREMITIES: No peripheral edema  HEMATOLOGIC: No significant bruising NEUROLOGIC: Oriented to person, place, and time. Nonfocal. Normal muscle tone.  PSYCHIATRIC: Normal mood and affect. Normal behavior. Cooperative  CARDIAC DATABASE: EKG: 11/20/20: Sinus Rhythm, 82bpm, LAE, normal axis, PRWP, without underlying ischemia or injury pattern.   Echocardiogram: 12/11/2020:  Left ventricle cavity is normal in size. Moderate concentric hypertrophy of the left ventricle. Normal global wall motion. Normal LV systolic function with EF 55%. Doppler evidence of grade I (impaired) diastolic dysfunction, normal LAP.  Mild tricuspid regurgitation.  No evidence of pulmonary hypertension.  Stress Testing: Exercise treadmill stress test 11/30/2020: Exercise treadmill stress test performed using Bruce protocol.  Patient reached 7.2 METS, and 99% of age predicted maximum heart rate.  Exercise capacity was low.  No chest pain reported.  Normal heart rate and hemodynamic response. Stress EKG showed sinus tachycardia, 2-3 mm horizontal/down-sloping ST depressions in leads II, III, aVF, V4-V6, 2 mm ST elevations in aVR, V1, partially normalize 2 min into recovery.  High risk study.  Heart Catheterization: None  LABORATORY DATA: External Labs: Collected: 10/23/2020 at [facility] Creatinine 1.1 mg/dL. eGFR: >60 mL/min per 1.73 m Lipid profile: Total cholesterol 176, triglycerides 54, HDL 83, LDL 82, non-HDL 93  IMPRESSION:    ICD-10-CM   1. Abnormal stress ECG with treadmill  R94.39 CT CORONARY MORPH W/CTA COR W/SCORE W/CA W/CM &/OR WO/CM    CT CORONARY FRACTIONAL FLOW RESERVE DATA PREP    CT CORONARY FRACTIONAL FLOW RESERVE FLUID ANALYSIS    Basic metabolic panel    metoprolol tartrate (LOPRESSOR) 50 MG tablet  2. Benign hypertension  I10 metoprolol tartrate (LOPRESSOR) 50 MG tablet  3. Mixed hyperlipidemia  E78.2   4. Former smoker  Z87.891   5. Encounter to discuss test results  Z71.2      RECOMMENDATIONS: Ian Gordon is a 65 y.o. male whose past medical history and cardiac risk factors include: Hypertension, hyperlipidemia, former smoker, advanced age.  Abnormal exercise treadmill stress test:  Given his cardiovascular risk factors and uncontrolled hypertension he underwent exercise treadmill stress test which was reported to be high risk.  Shared decision was to proceed with coronary CTA with possible CT FFR  Patient is instructed to start taking Lopressor 50 mg p.o. twice daily once his coronary CTA is scheduled to help facilitate a better ventricular rate.  In the interim, if he develops any chest pain or shortness of breath that increases in intensity, frequency, and/or duration  patient is advised to go to the closest ER via EMS.  Benign essential hypertension:  Improving.    Continue current medical management   Patient has reduced his salt intake.    Patient is advised to keep a blood pressure log and to bring it in at the next office visit for review.    Mixed hyperlipidemia: Currently on statin therapy.  Most recent lipid profile reviewed.  Currently managed by primary care provider.  Former smoker: Educated on the importance of continued smoking cessation.  FINAL MEDICATION LIST END OF  ENCOUNTER: Meds ordered this encounter  Medications  . metoprolol tartrate (LOPRESSOR) 50 MG tablet    Sig: Take 1 tablet (50 mg total) by mouth 2 (two) times daily.    Dispense:  60 tablet    Refill:  0    There are no discontinued medications.   Current Outpatient Medications:  .  amLODipine (NORVASC) 10 MG tablet, Take 1 tablet by mouth daily., Disp: , Rfl:  .  aspirin EC 81 MG tablet, Take by mouth., Disp: , Rfl:  .  atorvastatin (LIPITOR) 20 MG tablet, Take 20 mg by mouth daily., Disp: , Rfl:  .  losartan (COZAAR) 100 MG tablet, Take 100 mg by mouth daily., Disp: , Rfl:  .  Multiple Vitamin (MULTIVITAMIN) tablet, Take 1 tablet by mouth daily., Disp: , Rfl:  .  metoprolol tartrate (LOPRESSOR) 50 MG tablet, Take 1 tablet (50 mg total) by mouth 2 (two) times daily., Disp: 60 tablet, Rfl: 0  Orders Placed This Encounter  Procedures  . CT CORONARY MORPH W/CTA COR W/SCORE W/CA W/CM &/OR WO/CM  . CT CORONARY FRACTIONAL FLOW RESERVE DATA PREP  . CT CORONARY FRACTIONAL FLOW RESERVE FLUID ANALYSIS  . Basic metabolic panel    There are no Patient Instructions on file for this visit.   --Continue cardiac medications as reconciled in final medication list. --Return in about 4 weeks (around 01/15/2021) for Follow up, BP, Review test results. Or sooner if needed. --Continue follow-up with your primary care physician regarding the management of your other chronic comorbid conditions.  Patient's questions and concerns were addressed to his satisfaction. He voices understanding of the instructions provided during this encounter.   This note was created using a voice recognition software as a result there may be grammatical errors inadvertently enclosed that do not reflect the nature of this encounter. Every attempt is made to correct such errors.  Rex Kras, [place], [facility]  Pager: [phone] Office: [phone]

## 2021-01-27 NOTE — Progress Notes (Signed)
ID:  Ian Gordon, DOB 1956-04-11, MRN [id number]  PCP:  Berkley Harvey, NP  Cardiologist:  Rex Kras, DO, [facility] (established care 11/20/2020)  Date: 01/28/21 Last Office Visit: 12/18/2020  Chief Complaint  Patient presents with  . Hypertension  . Follow-up  . Results    HPI  Ian Gordon is a 65 y.o. African-American male who presents to the office with a chief complaint of " follow-up to review test results and blood pressure management." Patient's past medical history and cardiovascular risk factors include: Hypertension, former smoker, HLD, advance age.   He is referred to the office at the request of Berkley Harvey, NP for evaluation of hypertension.  Since last office visit patient states that his home blood pressures are very well controlled.  When he checks it is usually ranging between 120-130 mmHg and the diastolic blood pressures are usually 80 mmHg.  He is tolerating the medication changes well without any side effects or intolerances.  Due to a high risk and GXT at the last office visit the shared decision was to undergo coronary CTA.  However, for reasons unknown the study has not been completed.  He denies any active chest pain at rest or with effort late activities.  FUNCTIONAL STATUS: Walks about 67mnutes a day and regularly does push ups.    ALLERGIES: No Known Allergies  MEDICATION LIST PRIOR TO VISIT: Current Meds  Medication Sig  . amLODipine (NORVASC) 10 MG tablet Take 1 tablet by mouth daily.  .Marland Kitchenaspirin EC 81 MG tablet Take by mouth.  .Marland Kitchenatorvastatin (LIPITOR) 20 MG tablet Take 20 mg by mouth daily.  .Marland Kitchenlosartan (COZAAR) 100 MG tablet Take 100 mg by mouth daily.  . metoprolol tartrate (LOPRESSOR) 50 MG tablet Take 1 tablet (50 mg total) by mouth 2 (two) times daily.  . Multiple Vitamin (MULTIVITAMIN) tablet Take 1 tablet by mouth daily.     PAST MEDICAL HISTORY: Past Medical History:  Diagnosis Date  . Hyperlipidemia   . Hypertension   . [place]     PAST SURGICAL HISTORY: Past Surgical History:  Procedure Laterality Date  . COLONOSCOPY  01/18/2016  . POLYPECTOMY    . TOTAL HIP ARTHROPLASTY     left hip  . WRIST SURGERY     right wrist/nerve surg    FAMILY HISTORY: The patient family history includes Congestive Heart Failure in his mother; Hypertension in his sister.  SOCIAL HISTORY:  The patient  reports that he has quit smoking. He has never used smokeless tobacco. He reports current alcohol use of about 2.0 standard drinks of alcohol per week. He reports that he does not use drugs.  REVIEW OF SYSTEMS: Review of Systems  Constitutional: Negative for chills and fever.  HENT: Negative for hoarse voice and nosebleeds.   Eyes: Negative for discharge, double vision and pain.  Cardiovascular: Negative for chest pain, claudication, dyspnea on exertion, leg swelling, near-syncope, orthopnea, palpitations, paroxysmal nocturnal dyspnea and syncope.  Respiratory: Negative for hemoptysis and shortness of breath.   Musculoskeletal: Negative for muscle cramps and myalgias.  Gastrointestinal: Negative for abdominal pain, constipation, diarrhea, hematemesis, hematochezia, melena, nausea and vomiting.  Neurological: Negative for dizziness and light-headedness.    PHYSICAL EXAM: Vitals with BMI 01/28/2021 12/18/2020 11/20/2020  Height 5' 11" 5' 11" -  Weight 199 lbs 199 lbs -  BMI 295.09232.67-  Systolic [id number] Diastolic 67 84 96  Pulse 71 89 86  CONSTITUTIONAL: Well-developed and well-nourished. No acute distress.  SKIN: Skin is warm and dry. No rash noted. No cyanosis. No pallor. No jaundice HEAD: Normocephalic and atraumatic.  EYES: No scleral icterus MOUTH/THROAT: Moist oral membranes.  NECK: No JVD present. No thyromegaly noted. No carotid bruits  LYMPHATIC: No visible cervical adenopathy.  CHEST Normal respiratory effort. No intercostal retractions  LUNGS: Clear to auscultation bilaterally.  No  stridor. No wheezes. No rales.  CARDIOVASCULAR: Regular rate and rhythm, positive S1-S2, no murmurs rubs or gallops appreciated. ABDOMINAL: Soft, nontender, nondistended, positive bowel sounds in all 4 quadrants no apparent ascites.  EXTREMITIES: No peripheral edema  HEMATOLOGIC: No significant bruising NEUROLOGIC: Oriented to person, place, and time. Nonfocal. Normal muscle tone.  PSYCHIATRIC: Normal mood and affect. Normal behavior. Cooperative  CARDIAC DATABASE: EKG: 11/20/20: Sinus Rhythm, 82bpm, LAE, normal axis, PRWP, without underlying ischemia or injury pattern.   Echocardiogram: 12/11/2020:  Left ventricle cavity is normal in size. Moderate concentric hypertrophy of the left ventricle. Normal global wall motion. Normal LV systolic function with EF 55%. Doppler evidence of grade I (impaired) diastolic dysfunction, normal LAP.  Mild tricuspid regurgitation.  No evidence of pulmonary hypertension.  Stress Testing: Exercise treadmill stress test 11/30/2020: Exercise treadmill stress test performed using Bruce protocol.  Patient reached 7.2 METS, and 99% of age predicted maximum heart rate.  Exercise capacity was low.  No chest pain reported.  Normal heart rate and hemodynamic response. Stress EKG showed sinus tachycardia, 2-3 mm horizontal/down-sloping ST depressions in leads II, III, aVF, V4-V6, 2 mm ST elevations in aVR, V1, partially normalize 2 min into recovery.  High risk study.  Heart Catheterization: None  LABORATORY DATA: External Labs: Collected: 10/23/2020 at [facility] Creatinine 1.1 mg/dL. eGFR: >60 mL/min per 1.73 m Lipid profile: Total cholesterol 176, triglycerides 54, HDL 83, LDL 82, non-HDL 93  IMPRESSION:    ICD-10-CM   1. Abnormal stress ECG with treadmill  F64.33 Basic metabolic panel    Magnesium  2. Benign hypertension  I10   3. Mixed hyperlipidemia  E78.2   4. Former smoker  Z87.891   5. Encounter to discuss test results   Z71.2      RECOMMENDATIONS: Ian Gordon is a 65 y.o. male whose past medical history and cardiac risk factors include: Hypertension, hyperlipidemia, former smoker, advanced age.  Abnormal exercise treadmill stress test:  Given his cardiovascular risk factors and uncontrolled hypertension he underwent exercise treadmill stress test which was reported to be high risk.  Shared decision was to proceed with coronary CTA with possible CT FFR  Continue Lopressor 50 mg p.o. twice daily once his coronary CTA is scheduled to help facilitate a better ventricular rate.  We may stop Lopressor once the coronary CTA is complete.  We will monitor  In the interim, if he develops any chest pain or shortness of breath that increases in intensity, frequency, and/or duration patient is advised to go to the closest ER via EMS.  Benign essential hypertension:  Office and home blood pressure is very well controlled.  Continue current medical management   Low-salt diet recommended  Mixed hyperlipidemia: Currently on statin therapy.  Most recent lipid profile reviewed.  Currently managed by primary care provider.  Former smoker: Educated on the importance of continued smoking cessation.  FINAL MEDICATION LIST END OF ENCOUNTER: No orders of the defined types were placed in this encounter.   There are no discontinued medications.   Current Outpatient Medications:  .  amLODipine (  NORVASC) 10 MG tablet, Take 1 tablet by mouth daily., Disp: , Rfl:  .  aspirin EC 81 MG tablet, Take by mouth., Disp: , Rfl:  .  atorvastatin (LIPITOR) 20 MG tablet, Take 20 mg by mouth daily., Disp: , Rfl:  .  losartan (COZAAR) 100 MG tablet, Take 100 mg by mouth daily., Disp: , Rfl:  .  metoprolol tartrate (LOPRESSOR) 50 MG tablet, Take 1 tablet (50 mg total) by mouth 2 (two) times daily., Disp: 60 tablet, Rfl: 0 .  Multiple Vitamin (MULTIVITAMIN) tablet, Take 1 tablet by mouth daily., Disp: , Rfl:   Orders Placed This  Encounter  Procedures  . Basic metabolic panel  . Magnesium    There are no Patient Instructions on file for this visit.   --Continue cardiac medications as reconciled in final medication list. --Return in about 6 months (around 07/30/2021) for Follow up, BP. Or sooner if needed. --Continue follow-up with your primary care physician regarding the management of your other chronic comorbid conditions.  Patient's questions and concerns were addressed to his satisfaction. He voices understanding of the instructions provided during this encounter.   This note was created using a voice recognition software as a result there may be grammatical errors inadvertently enclosed that do not reflect the nature of this encounter. Every attempt is made to correct such errors.  Rex Kras, [place], [facility]  Pager: [phone] Office: [phone]

## 2021-01-28 ENCOUNTER — Other Ambulatory Visit: Payer: Self-pay

## 2021-01-28 ENCOUNTER — Encounter: Payer: Self-pay | Admitting: Cardiology

## 2021-01-28 ENCOUNTER — Ambulatory Visit: Payer: 59 | Admitting: Cardiology

## 2021-01-28 VITALS — BP 108/67 | HR 71 | Temp 98.1°F | Resp 16 | Ht 71.0 in | Wt 199.0 lb

## 2021-01-28 DIAGNOSIS — Z712 Person consulting for explanation of examination or test findings: Secondary | ICD-10-CM

## 2021-01-28 DIAGNOSIS — E782 Mixed hyperlipidemia: Secondary | ICD-10-CM

## 2021-01-28 DIAGNOSIS — Z87891 Personal history of nicotine dependence: Secondary | ICD-10-CM

## 2021-01-28 DIAGNOSIS — I10 Essential (primary) hypertension: Secondary | ICD-10-CM

## 2021-01-28 DIAGNOSIS — R9439 Abnormal result of other cardiovascular function study: Secondary | ICD-10-CM

## 2021-07-30 ENCOUNTER — Ambulatory Visit: Payer: 59 | Admitting: Cardiology

## 2021-11-17 ENCOUNTER — Other Ambulatory Visit: Payer: Self-pay | Admitting: Nurse Practitioner

## 2021-11-17 DIAGNOSIS — Z87891 Personal history of nicotine dependence: Secondary | ICD-10-CM

## 2021-12-10 ENCOUNTER — Other Ambulatory Visit: Payer: Self-pay

## 2021-12-10 ENCOUNTER — Ambulatory Visit
Admission: RE | Admit: 2021-12-10 | Discharge: 2021-12-10 | Disposition: A | Discharge status: Home / Self Care | Payer: 59 | Source: Ambulatory Visit | Attending: Nurse Practitioner | Admitting: Nurse Practitioner

## 2021-12-10 DIAGNOSIS — Z136 Encounter for screening for cardiovascular disorders: Secondary | ICD-10-CM

## 2021-12-10 DIAGNOSIS — Z87891 Personal history of nicotine dependence: Secondary | ICD-10-CM

## 2022-07-12 ENCOUNTER — Ambulatory Visit: Payer: Medicare (Managed Care) | Admitting: Cardiology

## 2022-07-12 ENCOUNTER — Telehealth: Payer: Self-pay | Admitting: Cardiology

## 2022-07-12 NOTE — Telephone Encounter (Signed)
Please inform the patient that his insurance company will not approve a coronary CTA until he is reevaluated. Please have him schedule a follow-up visit to reevaluate his symptoms.  Erion Weightman [place], DO, [facility]

## 2022-07-12 NOTE — Telephone Encounter (Signed)
CCTA was ordered for the patient in April 2022.  Patient was contacted but never scheduled the CCTA.  Patient had an appt for 07/12/22 and wanting to know for what since he didn't have the CCTA done.  The patient had no questions or symptoms.  Today's appointment was cancelled and rescheduled to re-evaluate him for a CCTA.

## 2022-07-13 NOTE — Telephone Encounter (Signed)
Called and spoke to patient he voiced understanding he was transferred to front desk to get appt scheduled

## 2022-07-15 ENCOUNTER — Ambulatory Visit: Payer: Medicare (Managed Care) | Admitting: Cardiology

## 2022-07-15 ENCOUNTER — Encounter: Payer: Self-pay | Admitting: Cardiology

## 2022-07-15 VITALS — BP 125/82 | HR 78 | Temp 97.9°F | Resp 16 | Ht 71.0 in | Wt 202.0 lb

## 2022-07-15 DIAGNOSIS — Z87891 Personal history of nicotine dependence: Secondary | ICD-10-CM

## 2022-07-15 DIAGNOSIS — E782 Mixed hyperlipidemia: Secondary | ICD-10-CM

## 2022-07-15 DIAGNOSIS — R0602 Shortness of breath: Secondary | ICD-10-CM

## 2022-07-15 DIAGNOSIS — I10 Essential (primary) hypertension: Secondary | ICD-10-CM

## 2022-07-15 DIAGNOSIS — R9439 Abnormal result of other cardiovascular function study: Secondary | ICD-10-CM

## 2022-07-15 NOTE — Progress Notes (Signed)
ID:  Ian Gordon, DOB September 24, 1956, MRN [id number]  PCP:  Berkley Harvey, NP  Cardiologist:  Rex Kras, DO, [facility] (established care 11/20/2020)  Date: 07/15/22 Last Office Visit: 01/28/2022  Chief Complaint  Patient presents with   Shortness of Breath   Follow-up    HPI  Ian Gordon is a 66 y.o. African-American male whose past medical history and cardiovascular risk factors include: Hypertension, former smoker, HLD, advance age.   Initially referred to the practice back in February 2022 for evaluation and management of hypertension.  His medications have been uptitrated and his blood pressures are very well controlled.  Patient was referred back to the practice at the request of his PCP for evaluation of shortness of breath.  Couple weeks ago while he was in the hot weather he was experiencing shortness of breath which improved after resting and relaxing.  He has not had any reoccurrence of symptoms.  He denies orthopnea, paroxysmal nocturnal dyspnea or lower extremity swelling.  He was last seen in the office in April 2023 at that time a coronary CTA was requested as his exercise treadmill stress test was interpreted to be high risk.  However this did not occur for reasons unknown.  Patient denies anginal discomfort.  Patient is accompanied by his friend Jeani Hawking and patient provides verbal consent and having her present during the encounter.   FUNCTIONAL STATUS: Walks about 34mnutes a day and regularly does push ups.    ALLERGIES: No Known Allergies  MEDICATION LIST PRIOR TO VISIT: Current Meds  Medication Sig   amLODipine (NORVASC) 10 MG tablet Take 1 tablet by mouth daily.   aspirin EC 81 MG tablet Take by mouth.   atorvastatin (LIPITOR) 20 MG tablet Take 20 mg by mouth daily.   losartan (COZAAR) 100 MG tablet Take 100 mg by mouth daily.   metoprolol tartrate (LOPRESSOR) 50 MG tablet Take 1 tablet (50 mg total) by mouth 2 (two) times daily.   Multiple Vitamin  (MULTIVITAMIN) tablet Take 1 tablet by mouth daily.     PAST MEDICAL HISTORY: Past Medical History:  Diagnosis Date   Hyperlipidemia    Hypertension    Sickle cell trait ([place]     PAST SURGICAL HISTORY: Past Surgical History:  Procedure Laterality Date   COLONOSCOPY  01/18/2016   POLYPECTOMY     TOTAL HIP ARTHROPLASTY     left hip   WRIST SURGERY     right wrist/nerve surg    FAMILY HISTORY: The patient family history includes Congestive Heart Failure in his mother; Hypertension in his sister.  SOCIAL HISTORY:  The patient  reports that he has quit smoking. His smoking use included cigarettes. He has a 5.00 pack-year smoking history. He has never used smokeless tobacco. He reports current alcohol use of about 2.0 standard drinks of alcohol per week. He reports that he does not use drugs.  REVIEW OF SYSTEMS: Review of Systems  Cardiovascular:  Negative for chest pain, claudication, dyspnea on exertion, irregular heartbeat, leg swelling, near-syncope, orthopnea, palpitations, paroxysmal nocturnal dyspnea and syncope.  Respiratory:  Positive for shortness of breath (See HPI, now resolved).   Hematologic/Lymphatic: Negative for bleeding problem.  Musculoskeletal:  Negative for muscle cramps and myalgias.  Neurological:  Negative for dizziness and light-headedness.    PHYSICAL EXAM:    07/15/2022   10:47 AM 01/28/2021    2:33 PM 12/18/2020    9:57 AM  Vitals with BMI  Height _0  _1  _2   Weight 202 lbs 199 lbs 199 lbs  BMI 28.19 32.95 18.84  Systolic 166 063 016  Diastolic 82 67 84  Pulse 78 71 89    CONSTITUTIONAL: Well-developed and well-nourished. No acute distress.  SKIN: Skin is warm and dry. No rash noted. No cyanosis. No pallor. No jaundice HEAD: Normocephalic and atraumatic.  EYES: No scleral icterus MOUTH/THROAT: Moist oral membranes.  NECK: No JVD present. No thyromegaly noted. No carotid bruits  LYMPHATIC: No visible cervical adenopathy.  CHEST  Normal respiratory effort. No intercostal retractions  LUNGS: Clear to auscultation bilaterally.  No stridor. No wheezes. No rales.  CARDIOVASCULAR: Regular rate and rhythm, positive S1-S2, no murmurs rubs or gallops appreciated. ABDOMINAL: Soft, nontender, nondistended, positive bowel sounds in all 4 quadrants no apparent ascites.  EXTREMITIES: No peripheral edema  HEMATOLOGIC: No significant bruising NEUROLOGIC: Oriented to person, place, and time. Nonfocal. Normal muscle tone.  PSYCHIATRIC: Normal mood and affect. Normal behavior. Cooperative  RADIOLOGY: CT abdomen with contrast 05/31/2022 at [facility] 1. Small umbilical hernia containing adipose tissue, potential small  left paraumbilical hernia containing adipose tissue. No herniated  bowel.  2. Suspected hepatic steatosis.  3. Gallbladder wall thickening is probably attributable to  contraction.  4. Atherosclerosis is present, including aortoiliac atherosclerotic  disease.  5. Multilevel lumbar impingement.    CARDIAC DATABASE: EKG: 11/20/20: Sinus Rhythm, 82bpm, LAE, normal axis, PRWP, without underlying ischemia or injury pattern.   07/15/22: Normal sinus rhythm, 65 bpm, LAE, without underlying injury pattern.  Echocardiogram: 12/11/2020:  Left ventricle cavity is normal in size. Moderate concentric hypertrophy of the left ventricle. Normal global wall motion. Normal LV systolic function with EF 55%. Doppler evidence of grade I (impaired) diastolic dysfunction, normal LAP.  Mild tricuspid regurgitation.  No evidence of pulmonary hypertension.  Stress Testing: Exercise treadmill stress test 11/30/2020: Exercise treadmill stress test performed using Bruce protocol.  Patient reached 7.2 METS, and 99% of age predicted maximum heart rate.  Exercise capacity was low.  No chest pain reported.  Normal heart rate and hemodynamic response. Stress EKG showed sinus tachycardia, 2-3 mm  horizontal/down-sloping ST depressions in leads II, III, aVF, V4-V6, 2 mm ST elevations in aVR, V1, partially normalize 2 min into recovery.  High risk study.  Heart Catheterization: None  LABORATORY DATA: External Labs: Collected: 10/23/2020 at [facility] Creatinine 1.1 mg/dL. eGFR: >60 mL/min per 1.73 m Lipid profile: Total cholesterol 176, triglycerides 54, HDL 83, LDL 82, non-HDL 93  External Labs: Collected: 05/31/2022 at [place]. Hemoglobin 14.7 g/dL, hematocrit 43%. BNP 38. Sodium 139, potassium 3.9, chloride 104, bicarb 23, BUN 14, creatinine 1.01. AST 42, ALT 36, alkaline phosphatase 57   IMPRESSION:    ICD-10-CM   1. Shortness of breath  R06.02 EKG 12-Lead    PCV MYOCARDIAL PERFUSION WO LEXISCAN    2. Abnormal stress ECG with treadmill  R94.39 PCV MYOCARDIAL PERFUSION WO LEXISCAN    3. Benign hypertension  I10     4. Mixed hyperlipidemia  E78.2     5. Former smoker  Z87.891        RECOMMENDATIONS: Ian Gordon is a 66 y.o. male whose past medical history and cardiac risk factors include: Hypertension, hyperlipidemia, former smoker, advanced age.  Patient was referred to the practice for reevaluation of shortness of breath at the request of his PCP. Currently denies anginal discomfort or heart failure symptoms. Patient is attributing his  symptoms in the past due to hot weather/exhaustion.  He has not had any recurrences.  EKG today shows sinus rhythm without underlying ischemia or injury pattern.  Outside labs from 05/31/2022 independently reviewed and noted above for further reference.  Of note BNP is within normal limits.  He also had a CT of the abdomen with contrast results noted above (performed at outside facility).  He is noted to have aortic atherosclerosis.  He is currently on aspirin and statin therapy.  As part of his cardiovascular work-up in the past he had a GXT which  was reported to be high risk back in February 2022 thereafter was recommended to have a coronary CTA but for reasons unknown this was not performed.  In the lieu of abnormal GXT, shortness of breath, and risk factors this shared decision is to proceed with exercise nuclear stress test for further evaluation and management.  Further recommendations to follow.  FINAL MEDICATION LIST END OF ENCOUNTER: No orders of the defined types were placed in this encounter.   There are no discontinued medications.   Current Outpatient Medications:    amLODipine (NORVASC) 10 MG tablet, Take 1 tablet by mouth daily., Disp: , Rfl:    aspirin EC 81 MG tablet, Take by mouth., Disp: , Rfl:    atorvastatin (LIPITOR) 20 MG tablet, Take 20 mg by mouth daily., Disp: , Rfl:    losartan (COZAAR) 100 MG tablet, Take 100 mg by mouth daily., Disp: , Rfl:    metoprolol tartrate (LOPRESSOR) 50 MG tablet, Take 1 tablet (50 mg total) by mouth 2 (two) times daily., Disp: 60 tablet, Rfl: 0   Multiple Vitamin (MULTIVITAMIN) tablet, Take 1 tablet by mouth daily., Disp: , Rfl:   Orders Placed This Encounter  Procedures   PCV MYOCARDIAL PERFUSION WO LEXISCAN   EKG 12-Lead    There are no Patient Instructions on file for this visit.   --Continue cardiac medications as reconciled in final medication list. --Return in about 6 weeks (around 08/26/2022) for Reevaluation of, Dyspnea, Review test results. Or sooner if needed. --Continue follow-up with your primary care physician regarding the management of your other chronic comorbid conditions.  Patient's questions and concerns were addressed to his satisfaction. He voices understanding of the instructions provided during this encounter.   This note was created using a voice recognition software as a result there may be grammatical errors inadvertently enclosed that do not reflect the nature of this encounter. Every attempt is made to correct such errors.  Rex Kras, [place],  [facility]  Pager: [phone] Office: [phone]

## 2022-08-08 ENCOUNTER — Ambulatory Visit: Payer: Medicare (Managed Care)

## 2022-08-08 DIAGNOSIS — R0602 Shortness of breath: Secondary | ICD-10-CM

## 2022-08-08 DIAGNOSIS — R9439 Abnormal result of other cardiovascular function study: Secondary | ICD-10-CM

## 2022-08-16 NOTE — Progress Notes (Signed)
Tried calling patient no answer left a vm

## 2022-08-17 NOTE — Progress Notes (Signed)
Called and spoke to patient he voiced understanding he stated that morning he didn't take his blood pressure medicine but he will keep a log and he will call back to reschedule

## 2022-09-05 ENCOUNTER — Ambulatory Visit: Payer: Medicare (Managed Care) | Admitting: Cardiology

## 2023-05-11 IMAGING — US US ABDOMINAL AORTA SCREENING AAA
1 series · 14 of 15 positions shown · non-contrast
Comparison: None.

CLINICAL DATA: Male between 65-75 years of age with a smoking
history.

EXAM:
US ABDOMINAL AORTA MEDICARE SCREENING
TECHNIQUE: Ultrasound examination of the abdominal aorta was performed as a
screening evaluation for abdominal aortic aneurysm.

[Series 1: us abdominal aorta screening aaa · 0.23mm/px · 14 of 15 slices shown]
[im 1/15]
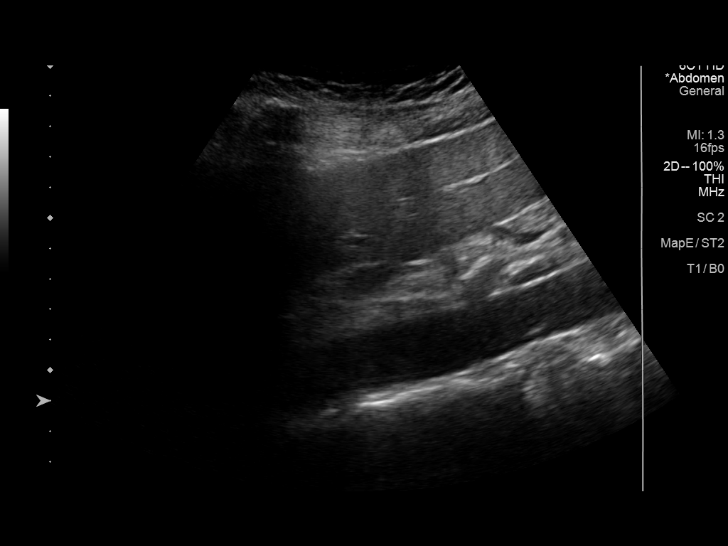
[im 2/15]
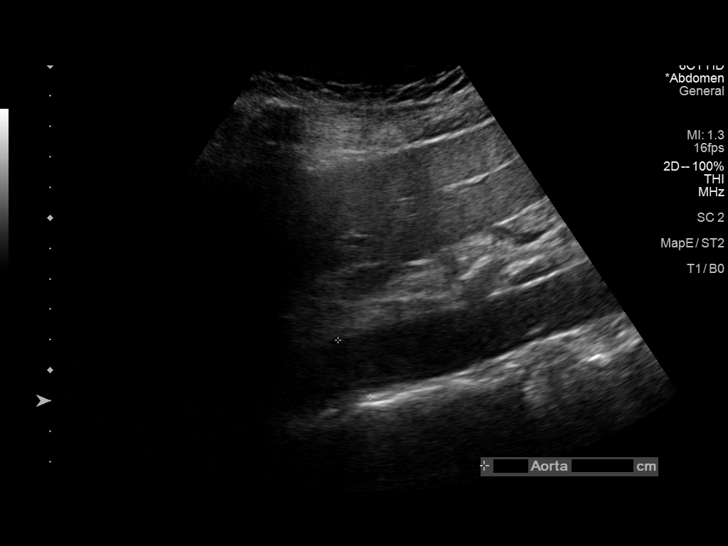
[im 3/15]
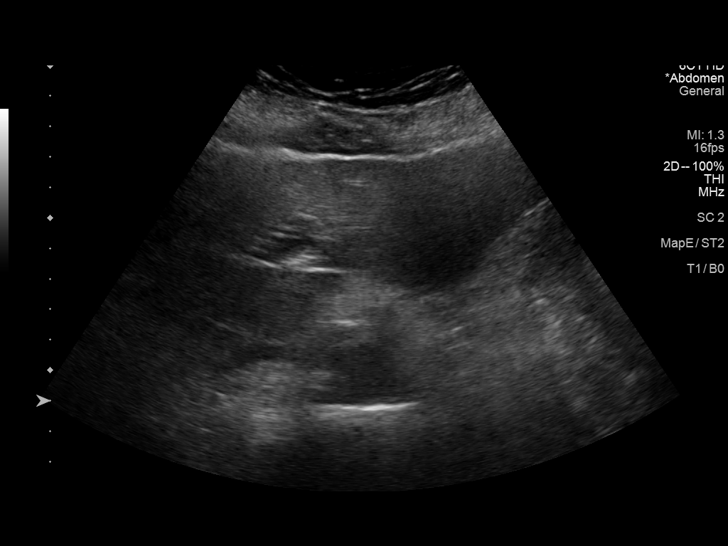
[im 4/15]
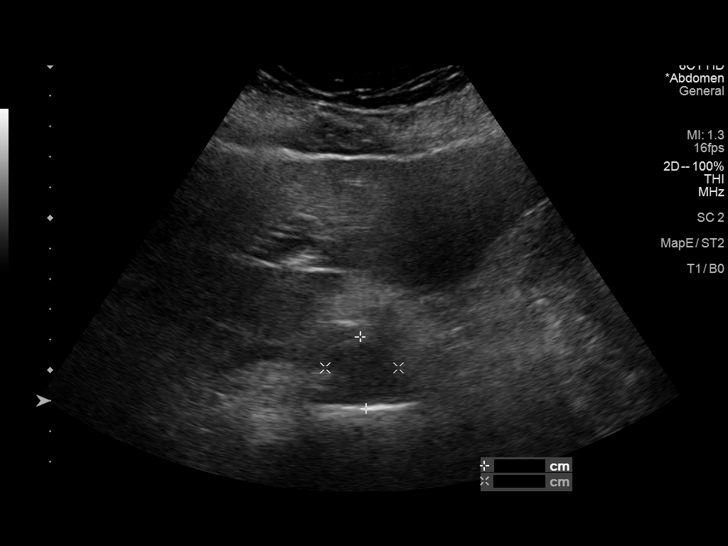
[im 5/15]
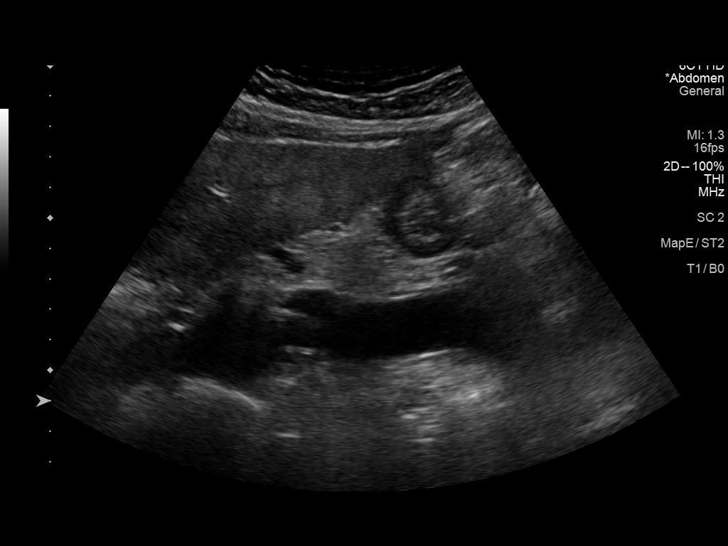
[im 6/15]
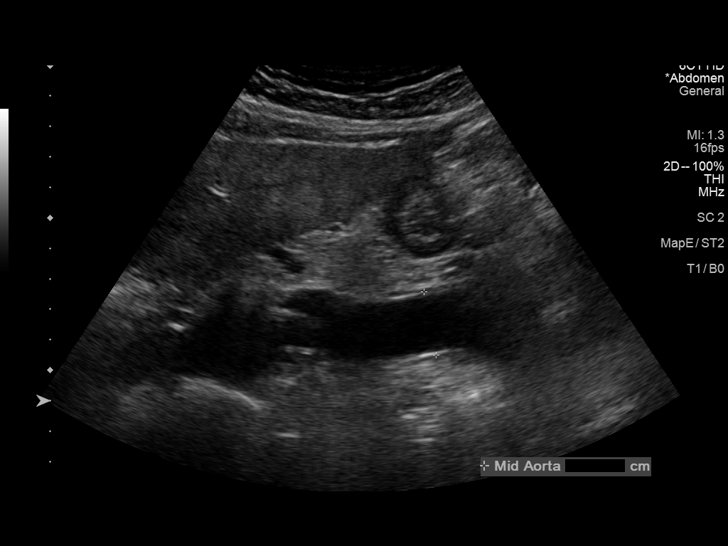
[im 7/15]
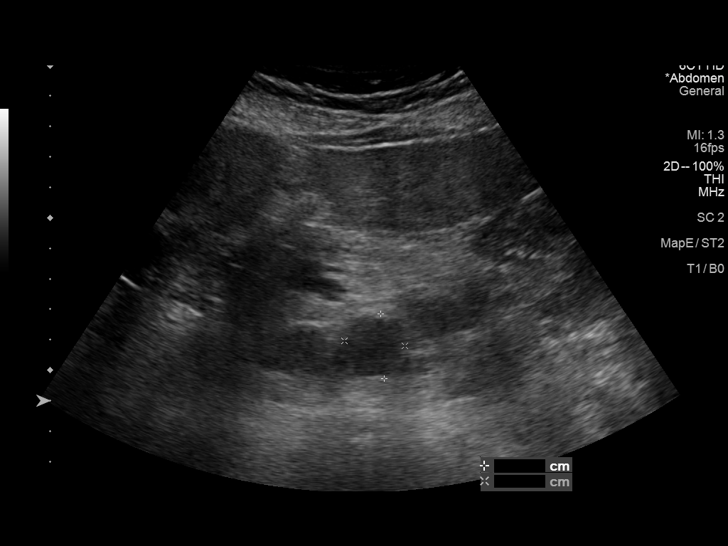
[im 9/15]
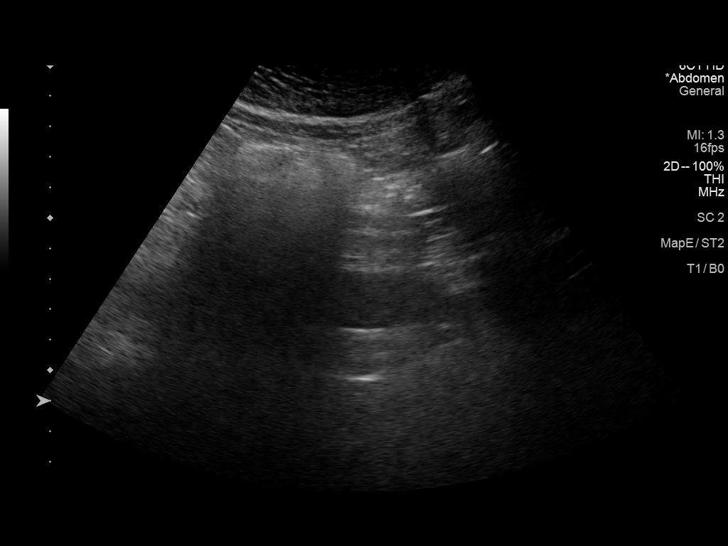
[im 10/15]
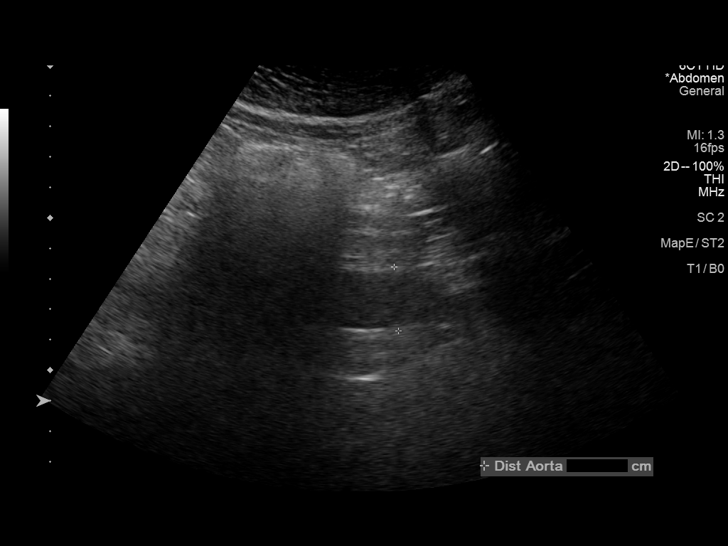
[im 11/15]
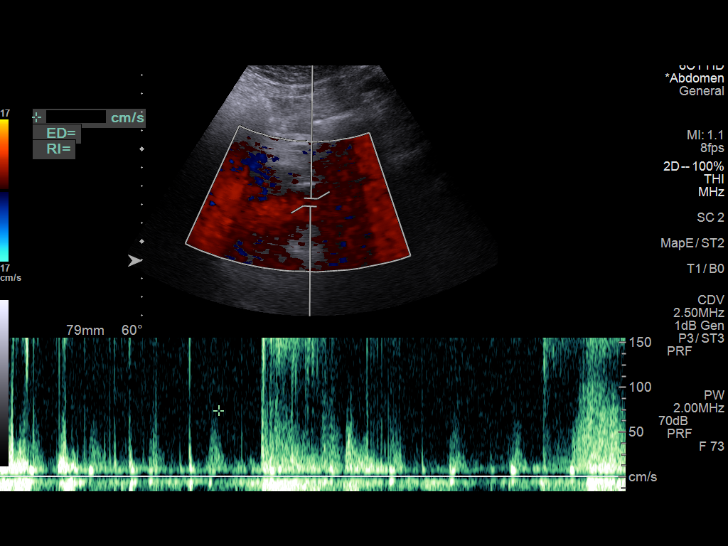
[im 12/15]
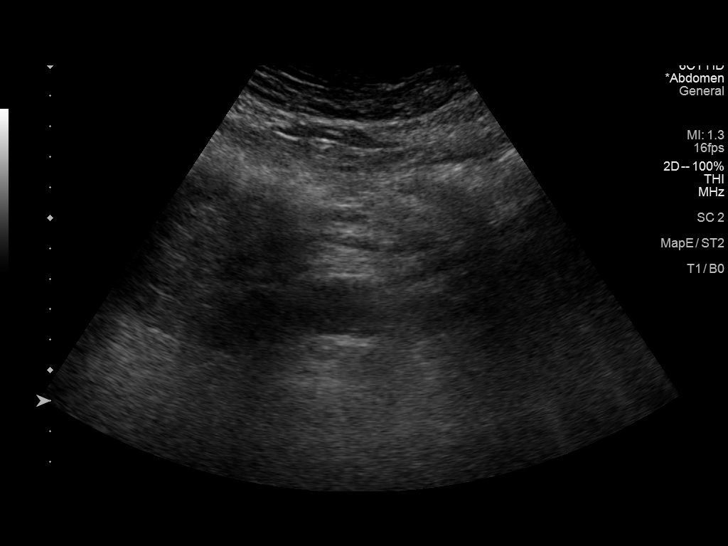
[im 13/15]
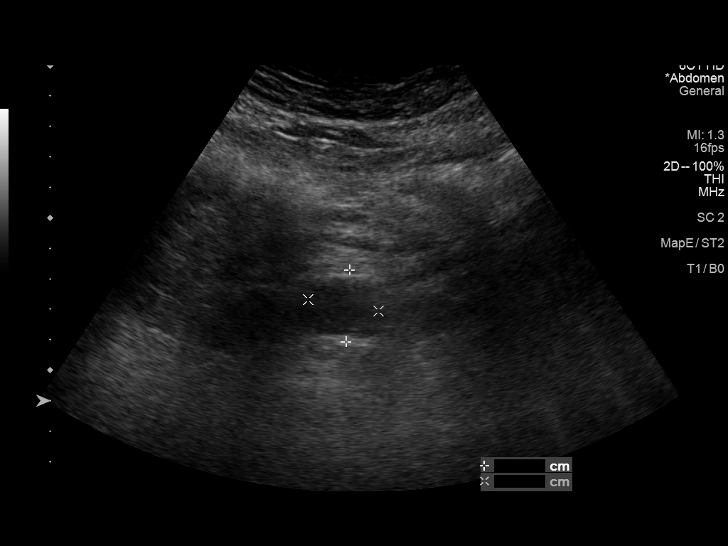
[im 14/15]
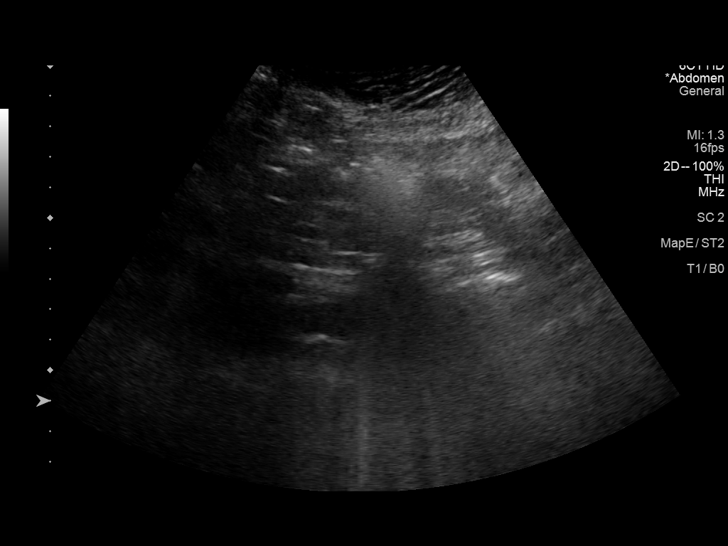
[im 15/15]
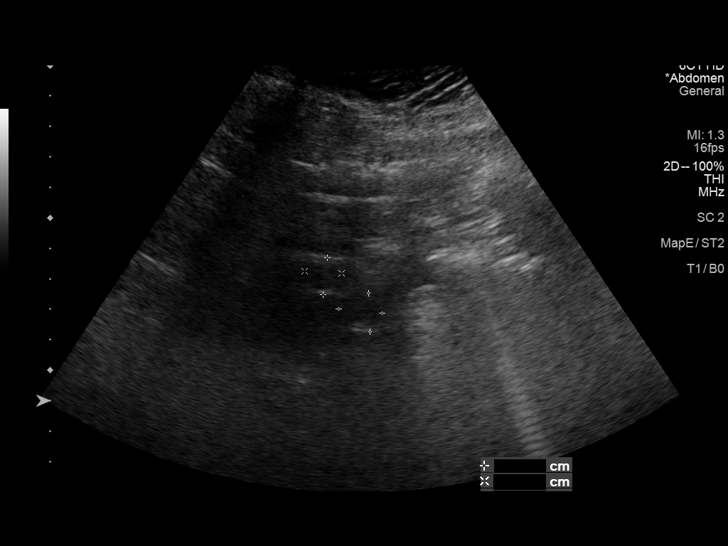

[14 of 15 positions shown; findings below may reference images not displayed]

FINDINGS: Abdominal aortic measurements as follows:

Proximal:  2.1 x 2.4 cm

Mid:  2.0 x 2.1 cm

Distal:  2.1 x 2.3 cm
IMPRESSION: No evidence of abdominal aortic aneurysm.
